# Patient Record
Sex: Male | Born: 1990
Health system: Southern US, Community
[De-identification: ages and names within clinical notes are randomized; demographics above are authoritative.]

## PROBLEM LIST (undated history)

## (undated) DIAGNOSIS — F988 Other specified behavioral and emotional disorders with onset usually occurring in childhood and adolescence: Secondary | ICD-10-CM

## (undated) DIAGNOSIS — M726 Necrotizing fasciitis: Secondary | ICD-10-CM

## (undated) DIAGNOSIS — Z1612 Extended spectrum beta lactamase (ESBL) resistance: Secondary | ICD-10-CM

## (undated) DIAGNOSIS — A419 Sepsis, unspecified organism: Secondary | ICD-10-CM

## (undated) DIAGNOSIS — A499 Bacterial infection, unspecified: Secondary | ICD-10-CM

## (undated) HISTORY — DX: Necrotizing fasciitis: M72.6

## (undated) HISTORY — DX: Gilbert syndrome: E80.4

## (undated) HISTORY — DX: Extended spectrum beta lactamase (ESBL) resistance: A49.9

## (undated) HISTORY — DX: Other specified behavioral and emotional disorders with onset usually occurring in childhood and adolescence: F98.8

## (undated) HISTORY — DX: Extended spectrum beta lactamase (ESBL) resistance: Z16.12

---

## 2009-04-10 ENCOUNTER — Ambulatory Visit: Payer: Self-pay | Admitting: Family Medicine

## 2009-04-10 DIAGNOSIS — F988 Other specified behavioral and emotional disorders with onset usually occurring in childhood and adolescence: Secondary | ICD-10-CM | POA: Insufficient documentation

## 2009-04-30 ENCOUNTER — Encounter: Payer: Self-pay | Admitting: Family Medicine

## 2009-04-30 DIAGNOSIS — J45909 Unspecified asthma, uncomplicated: Secondary | ICD-10-CM | POA: Insufficient documentation

## 2009-05-06 ENCOUNTER — Ambulatory Visit: Payer: Self-pay | Admitting: Family Medicine

## 2009-05-06 DIAGNOSIS — L659 Nonscarring hair loss, unspecified: Secondary | ICD-10-CM | POA: Insufficient documentation

## 2009-05-07 ENCOUNTER — Encounter: Payer: Self-pay | Admitting: Family Medicine

## 2009-05-08 ENCOUNTER — Encounter: Payer: Self-pay | Admitting: Family Medicine

## 2009-06-11 ENCOUNTER — Telehealth: Payer: Self-pay | Admitting: Family Medicine

## 2009-07-23 ENCOUNTER — Telehealth: Payer: Self-pay | Admitting: Family Medicine

## 2009-12-07 ENCOUNTER — Ambulatory Visit: Payer: Self-pay | Admitting: Family Medicine

## 2009-12-08 ENCOUNTER — Encounter: Payer: Self-pay | Admitting: Family Medicine

## 2010-01-08 ENCOUNTER — Ambulatory Visit: Payer: Self-pay | Admitting: Family Medicine

## 2010-01-08 DIAGNOSIS — B9789 Other viral agents as the cause of diseases classified elsewhere: Secondary | ICD-10-CM | POA: Insufficient documentation

## 2010-03-17 ENCOUNTER — Telehealth: Payer: Self-pay | Admitting: Family Medicine

## 2010-08-05 ENCOUNTER — Ambulatory Visit: Payer: Self-pay | Admitting: Family Medicine

## 2010-08-05 DIAGNOSIS — L738 Other specified follicular disorders: Secondary | ICD-10-CM | POA: Insufficient documentation

## 2010-08-05 DIAGNOSIS — L500 Allergic urticaria: Secondary | ICD-10-CM | POA: Insufficient documentation

## 2010-08-05 DIAGNOSIS — B079 Viral wart, unspecified: Secondary | ICD-10-CM | POA: Insufficient documentation

## 2010-08-05 DIAGNOSIS — L219 Seborrheic dermatitis, unspecified: Secondary | ICD-10-CM | POA: Insufficient documentation

## 2010-12-10 ENCOUNTER — Ambulatory Visit
Admission: RE | Admit: 2010-12-10 | Discharge: 2010-12-10 | Payer: Self-pay | Source: Home / Self Care | Attending: Family Medicine | Admitting: Family Medicine

## 2010-12-10 DIAGNOSIS — S6000XA Contusion of unspecified finger without damage to nail, initial encounter: Secondary | ICD-10-CM | POA: Insufficient documentation

## 2010-12-10 DIAGNOSIS — J309 Allergic rhinitis, unspecified: Secondary | ICD-10-CM | POA: Insufficient documentation

## 2010-12-30 ENCOUNTER — Ambulatory Visit
Admission: RE | Admit: 2010-12-30 | Discharge: 2010-12-30 | Payer: Self-pay | Source: Home / Self Care | Attending: Family Medicine | Admitting: Family Medicine

## 2010-12-30 DIAGNOSIS — J029 Acute pharyngitis, unspecified: Secondary | ICD-10-CM | POA: Insufficient documentation

## 2011-01-04 NOTE — Letter (Signed)
Summary: Out of School  [facility]  [address]   [place], [place] [address]   Phone: [phone]  Fax: [phone]          January 08, 2010   Student:  Tyler Brock    To Whom It May Concern:   For Medical reasons, please excuse the above named student from school for the following dates:  Start:   January 08, 2010  End:    January 08, 2010  If you need additional information, please feel free to contact our office.   Sincerely,       Boyd Buffalo, MD    ****This is a legal document and cannot be tampered with.  Schools are authorized to verify all information and to do so accordingly. 

## 2011-01-04 NOTE — Assessment & Plan Note (Signed)
Summary: H/A, COUGH // RS   Vital Signs:  Patient profile:   20 year old male Temp:     99.5 degrees F oral BP sitting:   100 / 60  (left arm) Cuff size:   regular  Vitals Entered By: Sid Falcon LPN (January 08, 2010 4:08 PM) CC: Headache, cough, fever X e days   History of Present Illness: Acute visit. Onset about 2-3 days ago headache, mostly dry cough, mild photophobia, fatigue, body aches, and possible low-grade fever. No known ill exposures.. Denies any nausea, vomiting, or diarrhea. No rashes. Has not taken any medications other than Tylenol  Allergies: 1)  ! Codeine Sulfate (Codeine Sulfate) 2)  ! 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   Ears:  External ear exam shows no significant lesions or deformities.  Otoscopic examination reveals clear canals, tympanic membranes are intact bilaterally without bulging, retraction, inflammation or discharge. Hearing is grossly normal bilaterally. Nose:  External nasal examination shows no deformity or inflammation. Nasal mucosa are pink and moist without lesions or exudates. Mouth:  Oral mucosa and oropharynx without lesions or exudates.  Teeth in good repair. Neck:  No deformities, masses, or tenderness noted. Lungs:  Normal respiratory effort, chest expands symmetrically. Lungs are clear to auscultation, no crackles or wheezes. Heart:  Normal rate and regular rhythm. S1 and S2 normal without gallop, murmur, click, rub or other extra sounds. Skin:  no rash   Impression & Recommendations:  Problem #  1:  VIRAL INFECTION (ICD-079.99) treat symptomatically with Advil, Alleve, or Tylenol and consider OTC Mucinex.  Complete Medication List: 1)  Ritalin 10 Mg Tabs (Methylphenidate hcl) .... As needed 2)  Vyvanse 40 Mg Caps (Lisdexamfetamine dimesylate) .... Once daily 3)  Vyvanse 40 Mg Caps (Lisdexamfetamine dimesylate) .... Once daily may fill in one month 4)  Vyvanse 40 Mg Caps (Lisdexamfetamine dimesylate) .... Once daily may fill on two months 5)  Fluocinolone Acetonide 0.025 % Crea (Fluocinolone acetonide) .... Apply to affected rash two times a day prn  Patient Instructions: 1)  Try Mucinex or Mucinex DM for cough 2)  Get plenty of rest, drink lots of clear liquids, and use Tylenol or Ibuprofen for fever and comfort. Return in 7-10 days if you're not better: sooner if you'er feeling worse.

## 2011-01-04 NOTE — Assessment & Plan Note (Signed)
Summary: rash that comes and goes//ccm pt rsc appt time/njr   Vital Signs:  Patient profile:   20 year old male Temp:     98.0 degrees F oral BP sitting:   120 / 80  (left arm) Cuff size:   regular  Vitals Entered By: Sid Falcon LPN (August 05, 2010 4:18 PM)  History of Present Illness: Patient is here with multiple skin issues as below.  Has noticed some intermittent urticarial type lesions induced by pressure especially on both forearms. Does not use any antihistamines.  Has noticed painful non-pustular papule dorsum left forearm and left eyebrow region. No history of MRSA. No fever or chills.  patient has common wart right middle finger over DIP joint. This present for several weeks. He has not tried any over-the-counter topical treatments.  He has scaly slightly pruritic rash mid anterior chest wall. Also has recurrent scaly similar rash involving the ear canals and posterior to the ears bilaterally. Has used some cortisone cream in the past which has helped. No facial rash.  Allergies: 1)  ! Codeine Sulfate (Codeine Sulfate) 2)  ! 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    Physical Exam  General:  Well-developed,well-nourished,in no acute  distress; alert,appropriate and cooperative throughout examination Mouth:  Oral mucosa and oropharynx without lesions or exudates.  Teeth in good repair. Neck:  No deformities, masses, or tenderness noted. Lungs:  Normal respiratory effort, chest expands symmetrically. Lungs are clear to auscultation, no crackles or wheezes. Heart:  Normal rate and regular rhythm. S1 and S2 normal without gallop, murmur, click, rub or other extra sounds. Extremities:  right middle finger DIP joint common wart about 3 mm diameter Skin:  patient has scaly rash posterior both ears involving both ear canals left greater than right with erythematous base. He has urticaria which are induced by pressure on both forearms. He has nonspecific erythematous papule dorsum left forearm and left eyebrow region without pustular center.   Impression & Recommendations:  Problem # 1:  FOLLICULITIS (ICD-704.8) Assessment New reassurance given. Observation  Problem # 2:  ALLERGIC URTICARIA (ICD-708.0) Assessment: New try over-the-counter antihistamine such as Zyrtec or Allegra  Problem # 3:  DERMATITIS, SEBORRHEIC (ICD-690.10) Assessment: Deteriorated steroid cream prescribed. Cautioned about not using more than 2 weeks continuously  Problem # 4:  WART, VIRAL (ICD-078.10) Assessment: New  recommended treatment with liquid nitrogen patient consented after review of risks and benefits. This was treated several times without difficulty patient tolerated well he is aware these are resistant to treatment  Orders: Wart Destruct <14 ([address])  Complete Medication List: 1)  Ritalin 10 Mg Tabs (Methylphenidate hcl) .... As needed 2)  Vyvanse 40 Mg Caps (Lisdexamfetamine dimesylate) .... Once daily 3)  Vyvanse 40 Mg Caps (Lisdexamfetamine dimesylate) .... Once daily may fill in one month 4)  Vyvanse 40 Mg Caps (Lisdexamfetamine dimesylate) .... Once daily may fill on two months 5)  Fluocinolone Acetonide 0.025 % Crea  (Fluocinolone acetonide) .Marland KitchenMarland KitchenMarland Kitchen  Apply to affected rash two times a day prn  Patient Instructions: 1)  Consider antihistamine such as Allegra or Zyrtec for hives. 2)  Limit use of steroid cream to face and chest wall to no longer than 2 weeks continuously. Prescriptions: FLUOCINOLONE ACETONIDE 0.025 % CREA (FLUOCINOLONE ACETONIDE) apply to affected rash two times a day prn  #30 gm x 2   Entered and Authorized by:   Evelena Peat MD   Signed by:   Evelena Peat MD on 08/05/2010   Method used:   [facility] to        [facility]  [place] [address] [facility]retail[facility]       [address]       [place], [place]  [address]       Ph: [phone] or [phone]       Fax: [phone]   RxID:   [id number]

## 2011-01-04 NOTE — Assessment & Plan Note (Signed)
Summary: med check/cjr   Vital Signs:  Patient profile:   20 year old male Weight:      144 pounds Temp:     97.8 degrees F oral BP sitting:   100 / 60  (left arm) Cuff size:   regular  Vitals Entered By: Sid Falcon LPN (December 07, 2009 10:07 AM) CC: med check   History of Present Illness: Patient seen to discuss ADD issues.  He attends college locally and takes Vyvanse. This generally works well for him. No side effects from medication. Has had great difficulty gaining weight. Plans to start regimented exercise program soon. He has several questions regarding dietary factors and weight gain. He does not skip meals.  Patient also relates that he missed several classes last September which eventually required him to drop a class. Because of dropping that class he is in jeopardy of losing some financial aid. He had flulike illness with multiple symptoms including cough, body aches, nasal congestion and fever and missed classes from around the 10 th of September to 20 th of September. He is requesting an excuse because of illness requiring him to miss those classes.  Allergies: 1)  ! Codeine Sulfate (Codeine Sulfate) 2)  ! * Dexadrene  Past History:  Past Medical History: Last updated: 04/30/2009 ADD/Gilberts Disease Hepatitis/Jaundice Asthma--exercise induced  Review of Systems  The patient denies anorexia, fever, weight loss, chest pain, and headaches.    Physical Exam  General:  Well-developed,well-nourished,in no acute distress; alert,appropriate and cooperative throughout examination Mouth:  Oral mucosa and oropharynx without lesions or exudates.  Teeth in good repair. Neck:  No deformities, masses, or tenderness noted. Lungs:  Normal respiratory effort, chest expands symmetrically. Lungs are clear to auscultation, no crackles or wheezes. Heart:  Normal rate and regular rhythm. S1 and S2 normal without gallop, murmur, click, rub or other extra sounds.   Impression  & Recommendations:  Problem # 1:  ADD (ICD-314.00) stable current medication. Long discussion with patient regarding weight gain issues and how to safely gain lean body mass. We gave him some suggestions regarding recommended protein intake and overall calorie intake.  Complete Medication List: 1)  Ritalin 10 Mg Tabs (Methylphenidate hcl) .... As needed 2)  Vyvanse 40 Mg Caps (Lisdexamfetamine dimesylate) .... Once daily 3)  Vyvanse 40 Mg Caps (Lisdexamfetamine dimesylate) .... Once daily may fill in one month 4)  Vyvanse 40 Mg Caps (Lisdexamfetamine dimesylate) .... Once daily may fill on two months 5)  Fluocinolone Acetonide 0.025 % Crea (Fluocinolone acetonide) .... Apply to affected rash two times a day prn

## 2011-01-04 NOTE — Progress Notes (Signed)
Summary: Vyvanse refill X 3 months  Phone Note Call from Patient   Summary of Call: needs refill Vyvanse Initial call taken by: Evelena Peat MD,  March 17, 2010 2:14 PM  Follow-up for Phone Call        Rx given to father who was here for OV. Follow-up by: Sid Falcon LPN,  March 17, 2010 5:28 PM    Prescriptions: VYVANSE 40 MG CAPS (LISDEXAMFETAMINE DIMESYLATE) once daily May fill on two months  #30 x 0   Entered and Authorized by:   Evelena Peat MD   Signed by:   Evelena Peat MD on 03/17/2010   Method used:   Print then Give to Patient   RxID:   [id number] VYVANSE 40 MG CAPS (LISDEXAMFETAMINE DIMESYLATE) once daily May fill in one month  #30 x 0   Entered and Authorized by:   Evelena Peat MD   Signed by:   Evelena Peat MD on 03/17/2010   Method used:   Print then Give to Patient   RxID:   [id number] VYVANSE 40 MG CAPS (LISDEXAMFETAMINE DIMESYLATE) once daily  #30 x 0   Entered and Authorized by:   Evelena Peat MD   Signed by:   Evelena Peat MD on 03/17/2010   Method used:   Print then Give to Patient   RxID:   [id number]

## 2011-01-04 NOTE — Letter (Signed)
Summary: Out of School  [facility]  [address] [place], [place] [address]   Phone: [phone]  Fax: [phone]          January 08, 2010   Student:  Bethann Berkshire    To Whom It May Concern:   For Medical reasons, please excuse the above named student from school for the following dates:  Start:   January 08, 2010  End:    January 08, 2010  If you need additional information, please feel free to contact our office.   Sincerely,       Evelena Peat, MD    ****This is a legal document and cannot be tampered with.  Schools are authorized to verify all information and to do so accordingly.

## 2011-01-04 NOTE — Letter (Signed)
Summary: Unable to attend school due to illness  Unable to attend school due to illness   Imported By: Maryln Gottron 12/14/2009 10:24:09  _____________________________________________________________________  External Attachment:    Type:   Image     Comment:   External Document

## 2011-01-06 NOTE — Assessment & Plan Note (Signed)
Summary: ? strep//ccm   Vital Signs:  Patient profile:   20 year old male Temp:     98.6 degrees F BP sitting:   120 / 80  History of Present Illness: Patient seen with three-day history of sore throat. Some nonproductive cough. Minimal nasal congestion symptoms. Girlfriend recently with similar symptoms. No fever. Diffuse body aches. Some minimal relief with ibuprofen. Mild headaches. No rash.  Sore throat exacerbated by acidic foods.  Symptoms of moderate severity.  Allergies: 1)  ! Codeine Sulfate (Codeine Sulfate) 2)  ! 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  Otoscopic examination reveals clear canals, tympanic membranes are intact bilaterally without bulging, retraction, inflammation or discharge. Hearing is grossly normal bilaterally. Mouth:  mild erythema without exudate Neck:  No deformities, masses, or tenderness noted. Lungs:  Normal respiratory effort, chest expands symmetrically. Lungs are clear to auscultation, no crackles or wheezes. Heart:  Normal rate and regular rhythm. S1 and S2  normal without gallop, murmur, click, rub or other extra sounds. Skin:  no rashes.   Cervical Nodes:  No lymphadenopathy noted   Impression & Recommendations:  Problem # 1:  SORE THROAT (ICD-462) rapid strep negative. Suspect viral. Symptomatic treatment. Work note written. Orders: Rapid Strep (48546)  Complete Medication List: 1)  Ritalin 10 Mg Tabs (Methylphenidate hcl) .... As needed 2)  Vyvanse 40 Mg Caps (Lisdexamfetamine dimesylate) .... Once daily 3)  Vyvanse 40 Mg Caps (Lisdexamfetamine dimesylate) .... Once daily may fill in one month 4)  Vyvanse 40 Mg Caps (Lisdexamfetamine dimesylate) .... Once daily may fill on two months 5)  Fluocinolone Acetonide 0.025 % Crea (Fluocinolone acetonide) .... Apply to affected rash two times a day prn 6)  Singulair 10 Mg Tabs (Montelukast sodium) .... One by mouth once daily  Patient Instructions: 1)  Continued Advil, Tylenol, or Aleve for symptomatic relief 2)  Please schedule a follow-up appointment as needed .    Orders Added: 1)  Rapid Strep [87880] 2)  Est. Patient Level III [27035]

## 2011-01-06 NOTE — Assessment & Plan Note (Signed)
Summary: allergies/dm   Vital Signs:  Patient profile:   20 year old male Weight:      157 pounds Temp:     98.2 degrees F oral BP sitting:   120 / 80  (left arm) Cuff size:   regular  Vitals Entered By: Sid Falcon LPN (December 10, 2010 9:58 AM)  History of Present Illness: R index finger injury 3 days ago.  Small cut under nail Bled minimally. No signs of infection. Tetanus up to date.  Freq sneezing and tried brother's Singulair and worked well. Worked better than benadryl.  Would like rx for singulair. Symptoms worse at home.  Has other allergy symptoms including nasal congestion and itchy watery eyes.  Needs refill Vyvanse and working well.  Attending [employer]. No side effects from medication.  Allergies: 1)  ! Codeine Sulfate (Codeine Sulfate) 2)  ! 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    Physical Exam  General:  Well-developed,well-nourished,in no acute distress; alert,appropriate and cooperative throughout examination Head:  normocephalic and atraumatic.   Eyes:  No corneal or conjunctival inflammation noted. EOMI.  Perrla. Funduscopic exam benign, without hemorrhages, exudates or papilledema. Vision grossly normal. Ears:  External ear exam shows no significant lesions or deformities.  Otoscopic examination reveals clear canals, tympanic membranes are intact bilaterally without bulging, retraction, inflammation or discharge. Hearing is grossly normal bilaterally. Nose:  External nasal examination shows no deformity or inflammation. Nasal mucosa are pink and moist without lesions or exudates. Mouth:  Oral mucosa and oropharynx without lesions or exudates.  Teeth in good repair. Neck:  No deformities, masses, or tenderness noted. Lungs:  Normal respiratory effort, chest expands symmetrically. Lungs are clear to auscultation, no crackles or wheezes. Heart:  Normal rate and regular rhythm. S1 and S2 normal without gallop, murmur, click, rub or other extra sounds. Extremities:  R index finger small subungual bleed but no signif sized hematoma.  Nontender.  No signs of infection. Cervical Nodes:  No lymphadenopathy noted Psych:  normally interactive, good eye contact, not anxious appearing, and not depressed appearing.     Impression & Recommendations:  Problem # 1:  ALLERGIC RHINITIS (ICD-477.9) singulair 10 mg daily.  Problem # 2:  SUBUNGUAL HEMATOMA (ICD-923.3) reassurance.  Does not warrant drainage.  Problem # 3:  ADD (ICD-314.00) meds refilled.  Complete Medication List: 1)  Ritalin 10 Mg Tabs (Methylphenidate hcl) .... As needed 2)  Vyvanse 40 Mg Caps (Lisdexamfetamine dimesylate) .... Once daily 3)  Vyvanse 40 Mg Caps (Lisdexamfetamine dimesylate) .... Once daily may fill in one month 4)  Vyvanse 40 Mg Caps (Lisdexamfetamine dimesylate) .... Once daily may fill on two months 5)  Fluocinolone Acetonide 0.025 % Crea (Fluocinolone acetonide) .... Apply to affected rash two times a day prn 6)  Singulair 10 Mg Tabs (Montelukast sodium) .... One by mouth once daily Prescriptions: VYVANSE 40 MG CAPS  (  LISDEXAMFETAMINE DIMESYLATE) once daily May fill on two months  #30 x 0   Entered and Authorized by:   Evelena Peat MD   Signed by:   Evelena Peat MD on 12/10/2010   Method used:   Print then Give to Patient   RxID:   [id number] VYVANSE 40 MG CAPS (LISDEXAMFETAMINE DIMESYLATE) once daily May fill in one month  #30 x 0   Entered and Authorized by:   Evelena Peat MD   Signed by:   Evelena Peat MD on 12/10/2010   Method used:   Print then Give to Patient   RxID:   [id number] VYVANSE 40 MG CAPS (LISDEXAMFETAMINE DIMESYLATE) once daily  #30 x 0   Entered and Authorized by:   Evelena Peat MD   Signed by:   Evelena Peat MD on 12/10/2010   Method used:   Print then Give to Patient   RxID:   [id number] SINGULAIR 10 MG TABS (MONTELUKAST SODIUM) one by mouth once daily  #30 x 11   Entered and Authorized by:   Evelena Peat MD   Signed by:   Evelena Peat MD on 12/10/2010   Method used:   [facility]        [facility]  [place] [address] [facility]       [address]       [place], [place]  [address]       Ph: [phone] or [phone]       Fax: [phone]   RxID:   [id number]    Orders Added: 1)  Est. Patient Level IV [35361]

## 2011-04-01 ENCOUNTER — Other Ambulatory Visit: Payer: Self-pay | Admitting: Family Medicine

## 2011-04-01 NOTE — Telephone Encounter (Signed)
Please advise 

## 2011-04-01 NOTE — Telephone Encounter (Signed)
Pt called and is req to get new script for Vyvanse 40 mg 1 qd. Pt says he lost script. Lv detailed vm if pt not avail. Pts last med check ov was 12/07/2009

## 2011-04-03 NOTE — Telephone Encounter (Signed)
OK to refill

## 2011-04-04 MED ORDER — LISDEXAMFETAMINE DIMESYLATE 40 MG PO CAPS: 40.0000 mg | ORAL_CAPSULE | ORAL | Status: DC

## 2011-04-04 NOTE — Telephone Encounter (Signed)
One Vyvanse Rx ready for pick-up, pt informed on personally identified VM

## 2011-05-10 ENCOUNTER — Ambulatory Visit (INDEPENDENT_AMBULATORY_CARE_PROVIDER_SITE_OTHER): Payer: Managed Care, Other (non HMO) | Admitting: Internal Medicine

## 2011-05-10 ENCOUNTER — Encounter: Payer: Self-pay | Admitting: Internal Medicine

## 2011-05-10 DIAGNOSIS — L0291 Cutaneous abscess, unspecified: Secondary | ICD-10-CM

## 2011-05-10 DIAGNOSIS — L0390 Cellulitis, unspecified: Secondary | ICD-10-CM

## 2011-05-10 MED ORDER — CEPHALEXIN 500 MG PO CAPS: 500.0000 mg | ORAL_CAPSULE | Freq: Three times a day (TID) | ORAL | Status: AC

## 2011-05-10 NOTE — Assessment & Plan Note (Signed)
Border of erythema marked with pen. Begin keflex to completion. Followup if no improvement or worsening.

## 2011-05-10 NOTE — Progress Notes (Signed)
  Subjective:    Patient ID: Tyler Brock, male    DOB: November 26, 1991, 20 y.o.   MRN: [id number]  HPI Pt presents to clinic for evaluation of insect bite. Several days ago suffered visualized wasp sting to right posterior calf with immediate associated pain.  Area has developed ST swelling/edema and surrounding redness but no associated f/c, dyspnea, tongue swelling or drainage. Attempting otc hydrocortisone prn without significant improvement. No spreading of redness and no other locations of bites. No other alleviating or exacerbating factors. No other complaints.  Reviewed pmh, medications and allergies.  Review of Systems see hpi     Objective:   Physical Exam  Nursing note and vitals reviewed. Constitutional: He appears well-developed and well-nourished. No distress.  HENT:  Head: Normocephalic and atraumatic.  Nose: Nose normal.  Eyes: Conjunctivae are normal. No scleral icterus.  Neurological: He is alert.  Skin: Skin is warm and dry. No rash noted. He is not diaphoretic. There is erythema. No pallor.       Right posterior calf: ~6cm circular area of erythema, warmth and mild st swelling. No discharge or tenderness.  Psychiatric: He has a normal mood and affect.          Assessment & Plan:

## 2011-05-13 ENCOUNTER — Other Ambulatory Visit (INDEPENDENT_AMBULATORY_CARE_PROVIDER_SITE_OTHER): Payer: Managed Care, Other (non HMO)

## 2011-05-13 DIAGNOSIS — Z0000 Encounter for general adult medical examination without abnormal findings: Secondary | ICD-10-CM

## 2011-05-13 LAB — BASIC METABOLIC PANEL
BUN: 17 mg/dL (ref 6–23)
CO2: 30 mEq/L (ref 19–32)
Calcium: 9.2 mg/dL (ref 8.4–10.5)
Creatinine, Ser: 0.9 mg/dL (ref 0.4–1.5)
Glucose, Bld: 81 mg/dL (ref 70–99)

## 2011-05-13 LAB — CBC WITH DIFFERENTIAL/PLATELET
Basophils Absolute: 0 10*3/uL (ref 0.0–0.1)
Eosinophils Absolute: 0.3 10*3/uL (ref 0.0–0.7)
Lymphocytes Relative: 40.5 % (ref 12.0–46.0)
MCHC: 35.1 g/dL (ref 30.0–36.0)
Neutro Abs: 2.2 10*3/uL (ref 1.4–7.7)
Neutrophils Relative %: 47.1 % (ref 43.0–77.0)
Platelets: 181 10*3/uL (ref 150.0–400.0)
RDW: 12.4 % (ref 11.5–14.6)

## 2011-05-13 LAB — HEPATIC FUNCTION PANEL
Albumin: 4.8 g/dL (ref 3.5–5.2)
Alkaline Phosphatase: 87 U/L (ref 39–117)
Bilirubin, Direct: 0.3 mg/dL (ref 0.0–0.3)

## 2011-05-13 LAB — LIPID PANEL
HDL: 36.4 mg/dL — ABNORMAL LOW (ref >39.00)
LDL Cholesterol: 85 mg/dL (ref 0–99)
Total CHOL/HDL Ratio: 4

## 2011-05-13 LAB — POCT URINALYSIS DIPSTICK
Bilirubin, UA: NEGATIVE
Blood, UA: NEGATIVE
Glucose, UA: NEGATIVE
Nitrite, UA: NEGATIVE
Urobilinogen, UA: 0.2

## 2011-05-13 LAB — TSH: TSH: 1.04 u[IU]/mL (ref 0.35–5.50)

## 2011-05-20 ENCOUNTER — Encounter: Payer: Self-pay | Admitting: Family Medicine

## 2011-05-20 ENCOUNTER — Ambulatory Visit (INDEPENDENT_AMBULATORY_CARE_PROVIDER_SITE_OTHER): Payer: Managed Care, Other (non HMO) | Admitting: Family Medicine

## 2011-05-20 VITALS — BP 110/70 | HR 72 | Temp 98.4°F | Resp 12 | Ht 70.5 in | Wt 166.0 lb

## 2011-05-20 DIAGNOSIS — Z0000 Encounter for general adult medical examination without abnormal findings: Secondary | ICD-10-CM

## 2011-05-20 NOTE — Progress Notes (Signed)
  Subjective:    Patient ID: Tyler Brock, male    DOB: 03-28-1991, 20 y.o.   MRN: [id number]  HPI Patient seen for complete physical. He has history of ADD. Otherwise healthy. Concern for appetite suppression with stimulant medication. Since stopping stimulant medication has gained about 20 pounds. No recent exercise. Does have some increased fatigue. Generally sleeps well. No changes in diet.  Has some mild mood swings. No agitation or periods of insomnia. No prolonged periods of depression. Nonsmoker. No illicit drug use. Occasional alcohol use.  No past medical history on file. No past surgical history on file.  reports that he has never smoked. He does not have any smokeless tobacco history on file. He reports that he drinks alcohol. His drug history not on file. family history is not on file. Allergies  Allergen Reactions  . Codeine Sulfate      Review of Systems  Constitutional: Negative for fever, activity change, appetite change and fatigue.  HENT: Negative for ear pain, congestion and trouble swallowing.   Eyes: Negative for pain and visual disturbance.  Respiratory: Negative for cough, shortness of breath and wheezing.   Cardiovascular: Negative for chest pain and palpitations.  Gastrointestinal: Negative for nausea, vomiting, abdominal pain, diarrhea, constipation, blood in stool, abdominal distention and rectal pain.  Genitourinary: Negative for dysuria, hematuria and testicular pain.  Musculoskeletal: Negative for joint swelling and arthralgias.  Skin: Negative for rash.  Neurological: Negative for dizziness, syncope and headaches.  Hematological: Negative for adenopathy.  Psychiatric/Behavioral: Negative for confusion and dysphoric mood.       Objective:   Physical Exam  Constitutional: He is oriented to person, place, and time. He appears well-developed and well-nourished. No distress.  HENT:  Head: Normocephalic and atraumatic.  Right Ear: External ear normal.   Left Ear: External ear normal.  Mouth/Throat: Oropharynx is clear and moist.  Eyes: Conjunctivae and EOM are normal. Pupils are equal, round, and reactive to light.  Neck: Normal range of motion. Neck supple. No thyromegaly present.  Cardiovascular: Normal rate, regular rhythm and normal heart sounds.   No murmur heard. Pulmonary/Chest: No respiratory distress. He has no wheezes. He has no rales.  Abdominal: Soft. Bowel sounds are normal. He exhibits no distension and no mass. There is no tenderness. There is no rebound and no guarding.  Genitourinary:       Testes without mass  Musculoskeletal: He exhibits no edema.  Lymphadenopathy:    He has no cervical adenopathy.  Neurological: He is alert and oriented to person, place, and time. He displays normal reflexes. No cranial nerve deficit.  Skin: No rash noted.  Psychiatric: He has a normal mood and affect.          Assessment & Plan:  Complete physical. Labs reviewed with patient and all favorable. Establish more regular exercise. Discussed stimulant use. Discussed that all stimulants are associated with potential appetite suppression

## 2011-05-20 NOTE — Patient Instructions (Signed)
Establish more consistent exercise 

## 2011-08-12 ENCOUNTER — Encounter: Payer: Self-pay | Admitting: Family Medicine

## 2011-08-15 ENCOUNTER — Ambulatory Visit (INDEPENDENT_AMBULATORY_CARE_PROVIDER_SITE_OTHER): Payer: Managed Care, Other (non HMO) | Admitting: Family Medicine

## 2011-08-15 ENCOUNTER — Encounter: Payer: Self-pay | Admitting: Family Medicine

## 2011-08-15 VITALS — BP 120/70 | Temp 98.0°F | Wt 162.0 lb

## 2011-08-15 DIAGNOSIS — F988 Other specified behavioral and emotional disorders with onset usually occurring in childhood and adolescence: Secondary | ICD-10-CM

## 2011-08-15 DIAGNOSIS — Z23 Encounter for immunization: Secondary | ICD-10-CM

## 2011-08-15 MED ORDER — ATOVAQUONE-PROGUANIL HCL 250-100 MG PO TABS: ORAL_TABLET | ORAL | Status: DC

## 2011-08-15 MED ORDER — CIPROFLOXACIN HCL 500 MG PO TABS: ORAL_TABLET | ORAL | Status: DC

## 2011-08-15 MED ORDER — TYPHOID VACCINE PO CPDR: 1.0000 | DELAYED_RELEASE_CAPSULE | ORAL | Status: AC

## 2011-08-15 MED ORDER — TETANUS-DIPHTH-ACELL PERTUSSIS 5-2.5-18.5 LF-MCG/0.5 IM SUSP: 0.5000 mL | Freq: Once | INTRAMUSCULAR | Status: DC

## 2011-08-15 NOTE — Progress Notes (Signed)
  Subjective:    Patient ID: Tyler Brock, male    DOB: [birth year], 20 y.o.   MRN: [id number]  HPI Here for recommendations regarding travel medicine. Planned trip in October to [place]. No history of confirmed Tdap.  Needs flu vaccine. No history of hepatitis A. Also will need typhoid vaccine and malaria prophylaxis.  Requesting antibiotic for possible traveler's diarrhea. Only drug allergy is codeine.  Patient currently seeing psychiatrist for ADD.  Vyvanse changed to Focalin.   Review of Systems  Constitutional: Negative for fever, chills and appetite change.  Respiratory: Negative for shortness of breath.   Cardiovascular: Negative for chest pain.  Skin: Negative for rash.       Objective:   Physical Exam  Constitutional: He appears well-developed and well-nourished.  Cardiovascular: Normal rate, regular rhythm and normal heart sounds.   No murmur heard. Pulmonary/Chest: Effort normal and breath sounds normal. No respiratory distress. He has no wheezes. He has no rales.  Musculoskeletal: He exhibits no edema.          Assessment & Plan:  Travel medicine consultation. Patient needs tetanus booster along with flu vaccine and hepatitis A. Return in 6 months for hepatitis A booster. Discussed typhoid vaccination with option for oral versus shot and patient prefers the former. Prescribed Vivotif 1 every other day for 4 doses.  Malarone starting 1-2 days prior travel, daily during travel, and for one week after travel.

## 2011-08-15 NOTE — Patient Instructions (Signed)
Check on status of Hepatitis B vaccine.

## 2012-01-03 ENCOUNTER — Ambulatory Visit (INDEPENDENT_AMBULATORY_CARE_PROVIDER_SITE_OTHER): Payer: Managed Care, Other (non HMO) | Admitting: Family

## 2012-01-03 ENCOUNTER — Encounter: Payer: Self-pay | Admitting: Family

## 2012-01-03 DIAGNOSIS — J069 Acute upper respiratory infection, unspecified: Secondary | ICD-10-CM

## 2012-01-03 DIAGNOSIS — J309 Allergic rhinitis, unspecified: Secondary | ICD-10-CM

## 2012-01-03 MED ORDER — METHYLPREDNISOLONE ACETATE 40 MG/ML IJ SUSP
80.0000 mg | Freq: Once | INTRAMUSCULAR | Status: AC
  Administered 2012-01-03: 80 mg via INTRAMUSCULAR

## 2012-01-03 MED ORDER — FEXOFENADINE-PSEUDOEPHED ER 180-240 MG PO TB24: 1.0000 | ORAL_TABLET | Freq: Every day | ORAL | Status: AC

## 2012-01-03 NOTE — Patient Instructions (Signed)

## 2012-01-03 NOTE — Progress Notes (Signed)
Subjective:    Patient ID: Tyler Brock, male    DOB: December 21, 1990, 21 y.o.   MRN: [id number]  HPI 21 year old white male, nonsmoker, patient of Dr. Caryl Never is in today with complaints of cough, congestion, fatigue, sore throat and sneezing that is going on for one day. He has taken DayQuil with no relief. Denies any sick contacts. He denies any fever or chills.  Patient also has a history of ADD, and currently takes Vyvanse for tolerating it well.   Review of Systems  Constitutional: Positive for fatigue.  HENT: Positive for congestion, rhinorrhea and sneezing.   Eyes: Negative.   Respiratory: Positive for cough.   Cardiovascular: Negative.   Genitourinary: Negative.   Skin: Negative.   Neurological: Negative.   Hematological: Negative.   Psychiatric/Behavioral: Negative.        Past Medical History  Diagnosis Date  . ADD (attention deficit disorder)   . Gilbert's disease   . Hepatitis     jaundice  . Asthma     exercise induced     History   Social History  . Marital Status: Single    Spouse Name: N/A    Number of Children: N/A  . Years of Education: N/A   Occupational History  . Not on file.   Social History Main Topics  . Smoking status: Never Smoker   . Smokeless tobacco: Not on file  . Alcohol Use: Yes     socially  . Drug Use: Not on file  . Sexually Active: Not on file   Other Topics Concern  . 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  Family History  Problem Relation Age of Onset  . Alcohol abuse Father   . Cancer Father     lung  . Arthritis Other   . Cancer Other     breast, prostate  . Hyperlipidemia Other   . Hypertension Other     Allergies  Allergen Reactions  . Codeine Sulfate     Current Outpatient Prescriptions on File Prior to Visit  Medication Sig Dispense Refill  . dexmethylphenidate (FOCALIN) 5 MG  tablet Take by mouth daily.       . montelukast (SINGULAIR) 10 MG tablet Take 10 mg by mouth as needed.        Marland Kitchen atovaquone-proguanil (MALARONE) 250-100 MG TABS One daily.  Start 1-2 days prior to travel, daily during travel, and for one week after return.  16 tablet  0  . ciprofloxacin (CIPRO) 500 MG tablet One twice daily for 3 days as needed for traveler's diarrhea.  12 tablet  0  . fluocinolone (SYNALAR) 0.025 % cream Apply 1 application topically 2 (two) times daily as needed.       Marland Kitchen lisdexamfetamine (VYVANSE) 40 MG capsule Take 40 mg by mouth every morning. May fill in one month       . lisdexamfetamine (VYVANSE) 40 MG capsule Take 40 mg by mouth every morning. May fill in two months       . methylphenidate (RITALIN) 10 MG tablet Take 10 mg by mouth as needed.         Current Facility-Administered Medications on File Prior to Visit  Medication Dose Route Frequency Provider Last Rate Last Dose  . TDaP (BOOSTRIX) injection 0.5 mL  0.5 mL Intramuscular Once Kristian Covey, MD        BP  120/80  Temp(Src) 98.6 F (37 C) (Oral)  Wt 161 lb (73.029 kg)chart Objective:   Physical Exam  Constitutional: He is oriented to person, place, and time. He appears well-developed and well-nourished.  HENT:  Right Ear: External ear normal.  Left Ear: External ear normal.  Nose: Nose normal.  Mouth/Throat: Oropharynx is clear and moist.  Neck: Normal range of motion. Neck supple.  Cardiovascular: Normal rate, regular rhythm and normal heart sounds.   Pulmonary/Chest: Effort normal and breath sounds normal.  Musculoskeletal: Normal range of motion.  Neurological: He is alert and oriented to person, place, and time.  Skin: Skin is warm and dry.  Psychiatric: He has a normal mood and affect.          Assessment & Plan:  Assessment: Allergic rhinitis, upper respiratory infection, ADD  Plan: Allegra 24 hours once daily. Depo-Medrol 80 mg IM x1. Note was given for work and school for the next  2 days. Patient to call the office if symptoms worsen or persist, recheck a schedule, and when necessary.

## 2012-03-16 ENCOUNTER — Encounter: Payer: Self-pay | Admitting: Family Medicine

## 2012-03-16 ENCOUNTER — Ambulatory Visit (INDEPENDENT_AMBULATORY_CARE_PROVIDER_SITE_OTHER): Payer: Managed Care, Other (non HMO) | Admitting: Family Medicine

## 2012-03-16 VITALS — BP 122/72 | Temp 98.1°F | Wt 166.0 lb

## 2012-03-16 DIAGNOSIS — J069 Acute upper respiratory infection, unspecified: Secondary | ICD-10-CM

## 2012-03-16 DIAGNOSIS — H6120 Impacted cerumen, unspecified ear: Secondary | ICD-10-CM

## 2012-03-16 DIAGNOSIS — H6691 Otitis media, unspecified, right ear: Secondary | ICD-10-CM

## 2012-03-16 DIAGNOSIS — H6690 Otitis media, unspecified, unspecified ear: Secondary | ICD-10-CM

## 2012-03-16 MED ORDER — AMOXICILLIN 875 MG PO TABS: 875.0000 mg | ORAL_TABLET | Freq: Two times a day (BID) | ORAL | Status: DC

## 2012-03-16 NOTE — Progress Notes (Signed)
  Subjective:    Patient ID: Tyler Brock, male    DOB: 1991/11/26, 21 y.o.   MRN: [id number]  HPI  Patient seen with right ear pain for 2 days. Cold-like symptoms 2 weeks ago. Had residual cough productive of green mucus. Possible low-grade fever yesterday but temperature not taken. Denies any vertigo. No ear drainage. No wheezing.   Review of Systems  Constitutional: Negative for fever and chills.  HENT: Positive for ear pain. Negative for congestion and ear discharge.   Respiratory: Negative for wheezing.        Objective:   Physical Exam  Constitutional: He appears well-developed and well-nourished.  HENT:  Mouth/Throat: Oropharynx is clear and moist.       Cerumen impaction right canal. Left eardrum is clear and normal After cerumen removed right canal, TM is erythematous and dull.  Neck: Neck supple.  Cardiovascular: Normal rate and regular rhythm.   Pulmonary/Chest: Effort normal and breath sounds normal. No respiratory distress. He has no wheezes. He has no rales.  Lymphadenopathy:    He has no cervical adenopathy.          Assessment & Plan:  #1 cerumen impaction right canal #2 recent upper respiratory infection with persistent productive cough.  After cerumen removal has erythema right TM suggesting early otitis media.  Start Amoxicillin.

## 2012-03-16 NOTE — Patient Instructions (Signed)
Consider Debrox or Cerumenex wax softener for right ear

## 2012-03-19 ENCOUNTER — Telehealth: Payer: Self-pay | Admitting: Family Medicine

## 2012-03-19 NOTE — Telephone Encounter (Signed)
Pt been using medicine for ear for 3 days with no relief and would like to know what he needs to do please contact pt

## 2012-03-19 NOTE — Telephone Encounter (Signed)
Pt informed, he denies outter ear discomfort, still a lot of pressure complaints

## 2012-03-19 NOTE — Telephone Encounter (Signed)
Give antibiotics at least one week.  Is he having any pain with moving auricle (outer ear)?  If so, may need topical antibiotic.

## 2012-04-20 ENCOUNTER — Ambulatory Visit (INDEPENDENT_AMBULATORY_CARE_PROVIDER_SITE_OTHER): Payer: Managed Care, Other (non HMO) | Admitting: Family Medicine

## 2012-04-20 VITALS — BP 120/70 | Temp 98.2°F | Wt 163.0 lb

## 2012-04-20 DIAGNOSIS — H66009 Acute suppurative otitis media without spontaneous rupture of ear drum, unspecified ear: Secondary | ICD-10-CM

## 2012-04-20 DIAGNOSIS — H66001 Acute suppurative otitis media without spontaneous rupture of ear drum, right ear: Secondary | ICD-10-CM

## 2012-04-20 DIAGNOSIS — H60399 Other infective otitis externa, unspecified ear: Secondary | ICD-10-CM

## 2012-04-20 DIAGNOSIS — H6091 Unspecified otitis externa, right ear: Secondary | ICD-10-CM

## 2012-04-20 MED ORDER — NEOMYCIN-POLYMYXIN-HC 3.5-10000-1 OT SUSP: 3.0000 [drp] | Freq: Four times a day (QID) | OTIC | Status: AC

## 2012-04-20 MED ORDER — AMOXICILLIN-POT CLAVULANATE 875-125 MG PO TABS: 1.0000 | ORAL_TABLET | Freq: Two times a day (BID) | ORAL | Status: DC

## 2012-04-20 NOTE — Patient Instructions (Signed)
Otitis Externa  Otitis externa ("swimmer's ear") is a germ (bacterial) or fungal infection of the outer ear canal (from the eardrum to the outside of the ear). Swimming in dirty water may cause swimmer's ear. It also may be caused by moisture in the ear from water remaining after swimming or bathing. Often the first signs of infection may be itching in the ear canal. This may progress to ear canal swelling, redness, and pus drainage, which may be signs of infection.  HOME CARE INSTRUCTIONS    Apply the antibiotic drops to the ear canal as prescribed by your doctor.   This can be a very painful medical condition. A strong pain reliever may be prescribed.   Only take over-the-counter or prescription medicines for pain, discomfort, or fever as directed by your caregiver.   If your caregiver has given you a follow-up appointment, it is very important to keep that appointment. Not keeping the appointment could result in a chronic or permanent injury, pain, hearing loss and disability. If there is any problem keeping the appointment, you must call back to this facility for assistance.  PREVENTION    It is important to keep your ear dry. Use the corner of a towel to wick water out of the ear canal after swimming or bathing.   Avoid scratching in your ear. This can damage the ear canal or remove the protective wax lining the canal and make it easier for germs (bacteria) or a fungus to grow.   You may use ear drops made of rubbing alcohol and vinegar after swimming to prevent future "swimmer's ear" infections. Make up a small bottle of equal parts white vinegar and alcohol. Put 3 or 4 drops into each ear after swimming.   Avoid swimming in lakes, polluted water, or poorly chlorinated pools.  SEEK MEDICAL CARE IF:    An oral temperature above 102 F (38.9 C) develops.   Your ear is still painful after 3 days and shows signs of getting worse (redness, swelling, pain, or pus).  MAKE SURE YOU:    Understand these  instructions.   Will watch your condition.   Will get help right away if you are not doing well or get worse.  Document Released: 11/21/2005 Document Revised: 11/10/2011 Document Reviewed: 06/27/2008  [employer] Patient Information 2012 [employer].

## 2012-04-20 NOTE — Progress Notes (Signed)
  Subjective:    Patient ID: Tyler Brock, male    DOB: 05-Jul-1991, 21 y.o.   MRN: [id number]  HPI  Acute visit. Right ear pain for the past few days with some drainage in the canal. Was seen recently and had cerumen impaction was noted to have acute suppurative right otitis media. He finished full course of amoxicillin and felt better on medication. Denies any headaches. No fever or chills. No sore throat. No hearing loss.   Review of Systems  Constitutional: Negative for fever and chills.  HENT: Positive for ear pain and ear discharge. Negative for hearing loss, neck pain and neck stiffness.   Respiratory: Negative for cough and shortness of breath.        Objective:   Physical Exam  Constitutional: He appears well-developed and well-nourished.  HENT:  Left Ear: External ear normal.  Mouth/Throat: Oropharynx is clear and moist.       Patient has some mild swelling and erythema right external canal. Some yellow crusted drainage. Eardrum is dull and slightly erythematous. Left ear exam is normal  Neck: Neck supple.  Pulmonary/Chest: Effort normal and breath sounds normal. No respiratory distress. He has no wheezes. He has no rales.          Assessment & Plan:  Right otitis externa. Right suppurative otitis media not fully resolved. Augmentin 875 mg twice daily for 10 days. Cortisporin Otic suspension 3 drops right ear 4 times daily. Followup in 2 weeks if symptoms not resolving.

## 2012-10-02 ENCOUNTER — Ambulatory Visit (INDEPENDENT_AMBULATORY_CARE_PROVIDER_SITE_OTHER): Payer: Managed Care, Other (non HMO) | Admitting: Family Medicine

## 2012-10-02 ENCOUNTER — Encounter: Payer: Self-pay | Admitting: Family Medicine

## 2012-10-02 VITALS — BP 128/80 | HR 65 | Temp 98.3°F | Resp 16 | Wt 164.0 lb

## 2012-10-02 DIAGNOSIS — H6090 Unspecified otitis externa, unspecified ear: Secondary | ICD-10-CM

## 2012-10-02 DIAGNOSIS — H60399 Other infective otitis externa, unspecified ear: Secondary | ICD-10-CM

## 2012-10-02 MED ORDER — CIPROFLOXACIN-DEXAMETHASONE 0.3-0.1 % OT SUSP: 4.0000 [drp] | Freq: Two times a day (BID) | OTIC | Status: DC

## 2012-10-02 NOTE — Progress Notes (Signed)
Chief Complaint  Patient presents with  . ear    pain lt ear    HPI:  L Ear pain -started:2 days ago -symptoms: L ear pain -denies:fever, SOB, NVD, tooth pain, strep or mono exposure, nasal congestion -has tried: nothing -sick contacts: none -Hx of: otitis externa once before that resolved with drops, does swim, but has been 2 months, does use qtip in ears  ROS: See pertinent positives and negatives per HPI.  Past Medical History  Diagnosis Date  . ADD (attention deficit disorder)   . Gilbert's disease   . Hepatitis     jaundice  . Asthma     exercise induced     Family History  Problem Relation Age of Onset  . Alcohol abuse Father   . Cancer Father     lung  . Arthritis Other   . Cancer Other     breast, prostate  . Hyperlipidemia Other   . Hypertension Other     History   Social History  . Marital Status: Single    Spouse Name: N/A    Number of Children: N/A  . Years of Education: N/A   Social History Main Topics  . Smoking status: Never Smoker   . Smokeless tobacco: None  . Alcohol Use: Yes     socially  . Drug Use: None  . Sexually Active: None   Other Topics Concern  . 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 , Disp: , Rfl: ;  fexofenadine-pseudoephedrine (ALLEGRA-D 24) 180-240 MG per 24 hr tablet, Take 1 tablet by mouth daily., Disp: 30 tablet, Rfl: 2;  ciprofloxacin-dexamethasone (CIPRODEX) otic suspension, Place 4 drops into the left ear 2 (two) times daily. For 10 days., Disp: 7.5 mL, Rfl: 0  EXAM:  Filed Vitals:   10/02/12 1427  BP: 128/80  Pulse: 65  Temp: 98.3 F (36.8 C)  Resp: 16    There is no height on file to calculate BMI.  GENERAL: vitals reviewed and listed above, alert, oriented, appears well hydrated and in no acute distress  HEENT: atraumatic,  conjunttiva clear, no obvious abnormalities on inspection of external nose and ears, L ear canal with mild post erythema, swelling and crusting, TM normal  NECK: no obvious masses on inspection  MS: moves all extremities without noticeable abnormality  PSYCH: pleasant and cooperative, no obvious depression or anxiety  ASSESSMENT AND PLAN:  Discussed the following assessment and plan:  1. Otitis externa  ciprofloxacin-dexamethasone (CIPRODEX) otic suspension  -otitis externa mild, tx per above and return precuations -Patient advised to return or notify a doctor immediately if symptoms worsen or persist or new concerns arise.  There are no Patient Instructions on file for this visit.   Kriste Basque R.

## 2012-10-02 NOTE — Patient Instructions (Addendum)
Otitis Externa Otitis externa is a bacterial or fungal infection of the outer ear canal. This is the area from the eardrum to the outside of the ear. Otitis externa is sometimes called "swimmer's ear." CAUSES  Possible causes of infection include:  Swimming in dirty water.  Moisture remaining in the ear after swimming or bathing.  Mild injury (trauma) to the ear.  Objects stuck in the ear (foreign body).  Cuts or scrapes (abrasions) on the outside of the ear. SYMPTOMS  The first symptom of infection is often itching in the ear canal. Later signs and symptoms may include swelling and redness of the ear canal, ear pain, and yellowish-white fluid (pus) coming from the ear. The ear pain may be worse when pulling on the earlobe. DIAGNOSIS  Your caregiver will perform a physical exam. A sample of fluid may be taken from the ear and examined for bacteria or fungi. TREATMENT  Antibiotic ear drops are often given for 10 to 14 days. Treatment may also include pain medicine or corticosteroids to reduce itching and swelling. PREVENTION   Keep your ear dry. Use the corner of a towel to absorb water out of the ear canal after swimming or bathing.  Avoid scratching or putting objects inside your ear. This can damage the ear canal or remove the protective wax that lines the canal. This makes it easier for bacteria and fungi to grow.  Avoid swimming in lakes, polluted water, or poorly chlorinated pools.  You may use ear drops made of rubbing alcohol and vinegar after swimming. Combine equal parts of white vinegar and alcohol in a bottle. Put 3 or 4 drops into each ear after swimming. HOME CARE INSTRUCTIONS   Apply antibiotic ear drops to the ear canal as prescribed by your caregiver.  Only take over-the-counter or prescription medicines for pain, discomfort, or fever as directed by your caregiver.  If you have diabetes, follow any additional treatment instructions from your caregiver.  Keep all  follow-up appointments as directed by your caregiver. SEEK MEDICAL CARE IF:   You have a fever.  Your ear is still red, swollen, painful, or draining pus after 3 days.  Your redness, swelling, or pain gets worse.  You have a severe headache.  You have redness, swelling, pain, or tenderness in the area behind your ear. MAKE SURE YOU:   Understand these instructions.  Will watch your condition.  Will get help right away if you are not doing well or get worse. Document Released: 11/21/2005 Document Revised: 02/13/2012 Document Reviewed: 12/08/2011 [employer] Patient Information 2013 [employer].  

## 2012-11-05 ENCOUNTER — Ambulatory Visit: Payer: Managed Care, Other (non HMO) | Admitting: Family Medicine

## 2013-12-05 DIAGNOSIS — A419 Sepsis, unspecified organism: Secondary | ICD-10-CM

## 2013-12-05 HISTORY — DX: Sepsis, unspecified organism: A41.9

## 2014-02-21 ENCOUNTER — Ambulatory Visit (INDEPENDENT_AMBULATORY_CARE_PROVIDER_SITE_OTHER): Payer: Managed Care, Other (non HMO) | Admitting: Family Medicine

## 2014-02-21 ENCOUNTER — Encounter: Payer: Self-pay | Admitting: Family Medicine

## 2014-02-21 VITALS — BP 128/74 | HR 80 | Temp 98.3°F | Wt 204.0 lb

## 2014-02-21 DIAGNOSIS — J029 Acute pharyngitis, unspecified: Secondary | ICD-10-CM

## 2014-02-21 DIAGNOSIS — F988 Other specified behavioral and emotional disorders with onset usually occurring in childhood and adolescence: Secondary | ICD-10-CM

## 2014-02-21 LAB — POCT RAPID STREP A (OFFICE): RAPID STREP A SCREEN: NEGATIVE

## 2014-02-21 MED ORDER — AZITHROMYCIN 250 MG PO TABS: ORAL_TABLET | ORAL | Status: AC

## 2014-02-21 MED ORDER — LISDEXAMFETAMINE DIMESYLATE 40 MG PO CAPS: 40.0000 mg | ORAL_CAPSULE | ORAL | Status: DC

## 2014-02-21 MED ORDER — AMOXICILLIN 875 MG PO TABS: 875.0000 mg | ORAL_TABLET | Freq: Two times a day (BID) | ORAL | Status: AC

## 2014-02-21 NOTE — Progress Notes (Signed)
Pre visit review using our clinic review tool, if applicable. No additional management support is needed unless otherwise documented below in the visit note. 

## 2014-02-21 NOTE — Progress Notes (Signed)
   Subjective:    Patient ID: Tyler BerkshireDaniel Saravia, male    DOB: February 23, 1991, 23 y.o.   MRN: [id number]  Sore Throat  Associated symptoms include congestion and coughing. Pertinent negatives include no headaches.   3 day history of sore throat. 2 week history of sinus congestion and intermittent facial pain. Occasional discolored nasal mucus. He thinks he had some fever off and on for past week but none today. He has occasional dry cough. No relief with over-the-counter medications.  Patient has history of ADD. 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 This has worked well for him in past.   Past Medical History  Diagnosis Date  . ADD (attention deficit disorder)   . Gilbert's disease   . Hepatitis     jaundice  . Asthma     exercise induced    No past surgical history on file.  reports that he has never smoked. He does not have any smokeless tobacco history on file. He reports that he drinks alcohol. His drug history is not on file. family history includes Alcohol abuse in his father; Arthritis in his other; Cancer in his father and other; Hyperlipidemia in his other; Hypertension in his other. Allergies  Allergen Reactions  . Codeine Sulfate       Review of Systems  Constitutional: Positive for fever. Negative for chills.  HENT: Positive for congestion, sinus pressure and sore throat.   Respiratory: Positive for cough.   Neurological: Negative for headaches.  Hematological: Negative for adenopathy.       Objective:   Physical Exam  Constitutional: He appears well-developed and well-nourished.  HENT:  Right Ear: External ear normal.  Left Ear: External ear normal.  Minimal posterior pharynx erythema without exudate  Neck: Neck supple.  Pulmonary/Chest: Effort normal and breath sounds normal. No respiratory distress. He has no wheezes. He has no rales.  Lymphadenopathy:    He has no cervical adenopathy.            Assessment & Plan:   Pharyngitis. Question sinusitis. If sinus symptoms persist into next week start Zithromax. Rapid strep negative.  Attention deficit disorder. Refill Vyvanse 40 mg one daily with 3 month prescription written

## 2014-06-23 ENCOUNTER — Encounter: Payer: Self-pay | Admitting: Family Medicine

## 2014-06-23 ENCOUNTER — Ambulatory Visit (INDEPENDENT_AMBULATORY_CARE_PROVIDER_SITE_OTHER): Payer: BC Managed Care – PPO | Admitting: Family Medicine

## 2014-06-23 VITALS — BP 124/78 | HR 82 | Temp 98.4°F | Wt 198.0 lb

## 2014-06-23 DIAGNOSIS — H65199 Other acute nonsuppurative otitis media, unspecified ear: Secondary | ICD-10-CM

## 2014-06-23 DIAGNOSIS — H65119 Acute and subacute allergic otitis media (mucoid) (sanguinous) (serous), unspecified ear: Secondary | ICD-10-CM

## 2014-06-23 DIAGNOSIS — H65112 Acute and subacute allergic otitis media (mucoid) (sanguinous) (serous), left ear: Secondary | ICD-10-CM

## 2014-06-23 MED ORDER — LISDEXAMFETAMINE DIMESYLATE 40 MG PO CAPS: 40.0000 mg | ORAL_CAPSULE | ORAL | Status: DC

## 2014-06-23 MED ORDER — TRIAMCINOLONE ACETONIDE 0.1 % EX CREA: 1.0000 "application " | TOPICAL_CREAM | Freq: Two times a day (BID) | CUTANEOUS | Status: DC

## 2014-06-23 MED ORDER — AMOXICILLIN-POT CLAVULANATE 875-125 MG PO TABS: 1.0000 | ORAL_TABLET | Freq: Two times a day (BID) | ORAL | Status: AC

## 2014-06-23 NOTE — Progress Notes (Signed)
   Subjective:    Patient ID: Tyler BerkshireDaniel Brock, male    DOB: 07-Sep-1991, 23 y.o.   MRN: [id number]  Otalgia  Associated symptoms include headaches. Pertinent negatives include no sore throat.   Left ear ache. Onset Saturday. Had some sinus congestion and cough. No fever. Possibly some mild loss of hearing in the left ear since he's had the pain. He's had prior tympanostomy tubes in childhood. Intermittent mild headaches. Also requesting refill of his Vyvanse for ADD   Review of Systems  Constitutional: Positive for fatigue. Negative for fever and chills.  HENT: Positive for ear pain. Negative for sore throat.   Neurological: Positive for headaches.       Objective:   Physical Exam  Constitutional: He appears well-developed and well-nourished.  HENT:  Right Ear: External ear normal.  Left eardrum is erythematous with slightly distorted landmarks. Oropharynx is clear.  No visible perforation  Neck: Neck supple.  Cardiovascular: Normal rate.   Pulmonary/Chest: Effort normal and breath sounds normal. No respiratory distress. He has no wheezes. He has no rales.  Lymphadenopathy:    He has no cervical adenopathy.          Assessment & Plan:  Acute left otitis media. Augmentin 875 mg twice daily for 10 days. Tylenol or Motrin for symptom relief.

## 2014-06-23 NOTE — Patient Instructions (Signed)
Otitis Media Otitis media is redness, soreness, and swelling (inflammation) of the middle ear. Otitis media may be caused by allergies or, most commonly, by infection. Often it occurs as a complication of the common cold. SIGNS AND SYMPTOMS Symptoms of otitis media may include:  Earache.  Fever.  Ringing in your ear.  Headache.  Leakage of fluid from the ear. DIAGNOSIS To diagnose otitis media, your health care provider will examine your ear with an otoscope. This is an instrument that allows your health care provider to see into your ear in order to examine your eardrum. Your health care provider also will ask you questions about your symptoms. TREATMENT  Typically, otitis media resolves on its own within 3-5 days. Your health care provider may prescribe medicine to ease your symptoms of pain. If otitis media does not resolve within 5 days or is recurrent, your health care provider may prescribe antibiotic medicines if he or she suspects that a bacterial infection is the cause. HOME CARE INSTRUCTIONS   Take your medicine as directed until it is gone, even if you feel better after the first few days.  Only take over-the-counter or prescription medicines for pain, discomfort, or fever as directed by your health care provider.  Follow up with your health care provider as directed. SEEK MEDICAL CARE IF:  You have otitis media only in one ear, or bleeding from your nose, or both.  You notice a lump on your neck.  You are not getting better in 3-5 days.  You feel worse instead of better. SEEK IMMEDIATE MEDICAL CARE IF:   You have pain that is not controlled with medicine.  You have swelling, redness, or pain around your ear or stiffness in your neck.  You notice that part of your face is paralyzed.  You notice that the bone behind your ear (mastoid) is tender when you touch it. MAKE SURE YOU:   Understand these instructions.  Will watch your condition.  Will get help right  away if you are not doing well or get worse. Document Released: 08/26/2004 Document Revised: 11/26/2013 Document Reviewed: 06/18/2013 Exit[employer] Patient Information 2015 [employer]. This information is not intended to replace advice given to you by your health care provider. Make sure you discuss any questions you have with your health care provider.  

## 2014-06-23 NOTE — Progress Notes (Signed)
Pre visit review using our clinic review tool, if applicable. No additional management support is needed unless otherwise documented below in the visit note. 

## 2014-09-02 ENCOUNTER — Emergency Department (HOSPITAL_COMMUNITY)
Admission: EM | Admit: 2014-09-02 | Discharge: 2014-09-02 | Disposition: A | Discharge status: Home / Self Care | Payer: BC Managed Care – PPO | Attending: Emergency Medicine | Admitting: Emergency Medicine

## 2014-09-02 ENCOUNTER — Emergency Department (HOSPITAL_COMMUNITY): Payer: BC Managed Care – PPO

## 2014-09-02 ENCOUNTER — Encounter (HOSPITAL_COMMUNITY): Payer: Self-pay | Admitting: Emergency Medicine

## 2014-09-02 DIAGNOSIS — F988 Other specified behavioral and emotional disorders with onset usually occurring in childhood and adolescence: Secondary | ICD-10-CM | POA: Insufficient documentation

## 2014-09-02 DIAGNOSIS — S71109A Unspecified open wound, unspecified thigh, initial encounter: Secondary | ICD-10-CM | POA: Insufficient documentation

## 2014-09-02 DIAGNOSIS — J45909 Unspecified asthma, uncomplicated: Secondary | ICD-10-CM | POA: Insufficient documentation

## 2014-09-02 DIAGNOSIS — Z862 Personal history of diseases of the blood and blood-forming organs and certain disorders involving the immune mechanism: Secondary | ICD-10-CM | POA: Diagnosis not present

## 2014-09-02 DIAGNOSIS — Z79899 Other long term (current) drug therapy: Secondary | ICD-10-CM | POA: Insufficient documentation

## 2014-09-02 DIAGNOSIS — Z8719 Personal history of other diseases of the digestive system: Secondary | ICD-10-CM | POA: Insufficient documentation

## 2014-09-02 DIAGNOSIS — Y9389 Activity, other specified: Secondary | ICD-10-CM | POA: Diagnosis not present

## 2014-09-02 DIAGNOSIS — S81009A Unspecified open wound, unspecified knee, initial encounter: Secondary | ICD-10-CM | POA: Insufficient documentation

## 2014-09-02 DIAGNOSIS — Z8639 Personal history of other endocrine, nutritional and metabolic disease: Secondary | ICD-10-CM | POA: Insufficient documentation

## 2014-09-02 DIAGNOSIS — S71009A Unspecified open wound, unspecified hip, initial encounter: Secondary | ICD-10-CM | POA: Insufficient documentation

## 2014-09-02 DIAGNOSIS — IMO0002 Reserved for concepts with insufficient information to code with codable children: Secondary | ICD-10-CM | POA: Diagnosis not present

## 2014-09-02 DIAGNOSIS — S81809A Unspecified open wound, unspecified lower leg, initial encounter: Principal | ICD-10-CM

## 2014-09-02 DIAGNOSIS — Y9289 Other specified places as the place of occurrence of the external cause: Secondary | ICD-10-CM | POA: Insufficient documentation

## 2014-09-02 DIAGNOSIS — S91009A Unspecified open wound, unspecified ankle, initial encounter: Principal | ICD-10-CM

## 2014-09-02 DIAGNOSIS — T07XXXA Unspecified multiple injuries, initial encounter: Secondary | ICD-10-CM

## 2014-09-02 MED ORDER — ONDANSETRON 8 MG PO TBDP
8.0000 mg | ORAL_TABLET | Freq: Once | ORAL | Status: AC
  Administered 2014-09-02: 8 mg via ORAL

## 2014-09-02 MED ORDER — HYDROCODONE-ACETAMINOPHEN 5-325 MG PO TABS
ORAL_TABLET | ORAL | Status: DC
Start: 2014-09-02 — End: 2014-09-02
  Filled 2014-09-02: qty 1

## 2014-09-02 MED ORDER — LIDOCAINE-EPINEPHRINE (PF) 1 %-1:200000 IJ SOLN
INTRAMUSCULAR | Status: AC
  Filled 2014-09-02: qty 10

## 2014-09-02 MED ORDER — POVIDONE-IODINE 10 % EX SOLN
CUTANEOUS | Status: AC
  Filled 2014-09-02: qty 118

## 2014-09-02 MED ORDER — HYDROCODONE-ACETAMINOPHEN 5-325 MG PO TABS: 1.0000 | ORAL_TABLET | Freq: Four times a day (QID) | ORAL | Status: DC | PRN

## 2014-09-02 MED ORDER — HYDROCODONE-ACETAMINOPHEN 5-325 MG PO TABS
1.0000 | ORAL_TABLET | Freq: Once | ORAL | Status: AC
  Administered 2014-09-02: 1 via ORAL

## 2014-09-02 MED ORDER — LIDOCAINE-EPINEPHRINE (PF) 2 %-1:200000 IJ SOLN
INTRAMUSCULAR | Status: AC
  Filled 2014-09-02: qty 20

## 2014-09-02 MED ORDER — CEPHALEXIN 500 MG PO CAPS: 500.0000 mg | ORAL_CAPSULE | Freq: Four times a day (QID) | ORAL | Status: DC

## 2014-09-02 MED ORDER — HYDROCODONE-ACETAMINOPHEN 5-325 MG PO TABS
2.0000 | ORAL_TABLET | Freq: Once | ORAL | Status: AC
  Administered 2014-09-02: 2 via ORAL
  Filled 2014-09-02: qty 2

## 2014-09-02 MED ORDER — LIDOCAINE HCL (PF) 2 % IJ SOLN
INTRAMUSCULAR | Status: AC
  Filled 2014-09-02: qty 10

## 2014-09-02 MED ORDER — CEPHALEXIN 500 MG PO CAPS
1000.0000 mg | ORAL_CAPSULE | Freq: Once | ORAL | Status: AC
  Administered 2014-09-02: 1000 mg via ORAL
  Filled 2014-09-02: qty 2

## 2014-09-02 MED ORDER — ONDANSETRON 8 MG PO TBDP
ORAL_TABLET | ORAL | Status: AC
  Filled 2014-09-02: qty 1
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
## 2014-09-02 NOTE — ED Notes (Signed)
Pt gone to xray
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
## 2014-09-02 NOTE — ED Notes (Signed)
Wounds to bilateral legs cleaned out with warm saline

## 2014-09-02 NOTE — ED Notes (Signed)
Dr. Read DriversMolpus in with pt suturing wounds
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
## 2014-09-03 ENCOUNTER — Telehealth: Payer: Self-pay | Admitting: Family Medicine

## 2014-09-03 ENCOUNTER — Encounter (HOSPITAL_COMMUNITY): Payer: Self-pay | Admitting: Emergency Medicine

## 2014-09-03 ENCOUNTER — Inpatient Hospital Stay (HOSPITAL_COMMUNITY): Payer: BC Managed Care – PPO

## 2014-09-03 ENCOUNTER — Emergency Department (HOSPITAL_COMMUNITY): Payer: BC Managed Care – PPO

## 2014-09-03 ENCOUNTER — Inpatient Hospital Stay (HOSPITAL_COMMUNITY)
Admission: EM | Admit: 2014-09-03 | Discharge: 2014-09-09 | DRG: 853 | Disposition: A | Discharge status: Home Health | Payer: BC Managed Care – PPO | Attending: Internal Medicine | Admitting: Internal Medicine

## 2014-09-03 DIAGNOSIS — R6520 Severe sepsis without septic shock: Secondary | ICD-10-CM | POA: Diagnosis not present

## 2014-09-03 DIAGNOSIS — L03115 Cellulitis of right lower limb: Secondary | ICD-10-CM | POA: Diagnosis present

## 2014-09-03 DIAGNOSIS — R6521 Severe sepsis with septic shock: Secondary | ICD-10-CM | POA: Diagnosis present

## 2014-09-03 DIAGNOSIS — A419 Sepsis, unspecified organism: Secondary | ICD-10-CM | POA: Diagnosis not present

## 2014-09-03 DIAGNOSIS — L02419 Cutaneous abscess of limb, unspecified: Secondary | ICD-10-CM

## 2014-09-03 DIAGNOSIS — F688 Other specified disorders of adult personality and behavior: Secondary | ICD-10-CM | POA: Diagnosis present

## 2014-09-03 DIAGNOSIS — D696 Thrombocytopenia, unspecified: Secondary | ICD-10-CM | POA: Diagnosis not present

## 2014-09-03 DIAGNOSIS — E878 Other disorders of electrolyte and fluid balance, not elsewhere classified: Secondary | ICD-10-CM | POA: Diagnosis present

## 2014-09-03 DIAGNOSIS — N170 Acute kidney failure with tubular necrosis: Secondary | ICD-10-CM | POA: Diagnosis present

## 2014-09-03 DIAGNOSIS — K759 Inflammatory liver disease, unspecified: Secondary | ICD-10-CM | POA: Diagnosis present

## 2014-09-03 DIAGNOSIS — D62 Acute posthemorrhagic anemia: Secondary | ICD-10-CM | POA: Diagnosis present

## 2014-09-03 DIAGNOSIS — R197 Diarrhea, unspecified: Secondary | ICD-10-CM | POA: Diagnosis not present

## 2014-09-03 DIAGNOSIS — L03032 Cellulitis of left toe: Secondary | ICD-10-CM

## 2014-09-03 DIAGNOSIS — L0390 Cellulitis, unspecified: Secondary | ICD-10-CM | POA: Diagnosis present

## 2014-09-03 DIAGNOSIS — Z8249 Family history of ischemic heart disease and other diseases of the circulatory system: Secondary | ICD-10-CM | POA: Diagnosis not present

## 2014-09-03 DIAGNOSIS — E860 Dehydration: Secondary | ICD-10-CM | POA: Diagnosis present

## 2014-09-03 DIAGNOSIS — L500 Allergic urticaria: Secondary | ICD-10-CM

## 2014-09-03 DIAGNOSIS — E872 Acidosis: Secondary | ICD-10-CM | POA: Diagnosis present

## 2014-09-03 DIAGNOSIS — R739 Hyperglycemia, unspecified: Secondary | ICD-10-CM | POA: Diagnosis present

## 2014-09-03 DIAGNOSIS — L03119 Cellulitis of unspecified part of limb: Secondary | ICD-10-CM | POA: Diagnosis not present

## 2014-09-03 DIAGNOSIS — J45909 Unspecified asthma, uncomplicated: Secondary | ICD-10-CM | POA: Diagnosis present

## 2014-09-03 DIAGNOSIS — M726 Necrotizing fasciitis: Secondary | ICD-10-CM | POA: Diagnosis present

## 2014-09-03 DIAGNOSIS — E871 Hypo-osmolality and hyponatremia: Secondary | ICD-10-CM | POA: Diagnosis present

## 2014-09-03 DIAGNOSIS — Z809 Family history of malignant neoplasm, unspecified: Secondary | ICD-10-CM | POA: Diagnosis not present

## 2014-09-03 DIAGNOSIS — B9620 Unspecified Escherichia coli [E. coli] as the cause of diseases classified elsewhere: Secondary | ICD-10-CM | POA: Diagnosis present

## 2014-09-03 LAB — URINALYSIS, ROUTINE W REFLEX MICROSCOPIC
Bilirubin Urine: NEGATIVE
GLUCOSE, UA: NEGATIVE mg/dL
KETONES UR: NEGATIVE mg/dL
LEUKOCYTES UA: NEGATIVE
NITRITE: NEGATIVE
PH: 5.5 (ref 5.0–8.0)
Protein, ur: 30 mg/dL — AB
SPECIFIC GRAVITY, URINE: 1.016 (ref 1.005–1.030)
Urobilinogen, UA: 0.2 mg/dL (ref 0.0–1.0)

## 2014-09-03 LAB — CBC WITH DIFFERENTIAL/PLATELET
Basophils Absolute: 0 10*3/uL (ref 0.0–0.1)
Basophils Relative: 0 % (ref 0–1)
EOS ABS: 0 10*3/uL (ref 0.0–0.7)
Eosinophils Relative: 0 % (ref 0–5)
HCT: 38.2 % — ABNORMAL LOW (ref 39.0–52.0)
Hemoglobin: 13.8 g/dL (ref 13.0–17.0)
LYMPHS PCT: 3 % — AB (ref 12–46)
Lymphs Abs: 0.5 10*3/uL — ABNORMAL LOW (ref 0.7–4.0)
MCH: 30.8 pg (ref 26.0–34.0)
MCHC: 36.1 g/dL — AB (ref 30.0–36.0)
MCV: 85.3 fL (ref 78.0–100.0)
MONO ABS: 0.8 10*3/uL (ref 0.1–1.0)
Monocytes Relative: 5 % (ref 3–12)
NEUTROS PCT: 92 % — AB (ref 43–77)
Neutro Abs: 15.3 10*3/uL — ABNORMAL HIGH (ref 1.7–7.7)
Platelets: 123 10*3/uL — ABNORMAL LOW (ref 150–400)
RBC: 4.48 MIL/uL (ref 4.22–5.81)
RDW: 12.3 % (ref 11.5–15.5)
WBC: 16.6 10*3/uL — ABNORMAL HIGH (ref 4.0–10.5)

## 2014-09-03 LAB — GLUCOSE, CAPILLARY: Glucose-Capillary: 81 mg/dL (ref 70–99)

## 2014-09-03 LAB — CBG MONITORING, ED: GLUCOSE-CAPILLARY: 100 mg/dL — AB (ref 70–99)

## 2014-09-03 LAB — COMPREHENSIVE METABOLIC PANEL
ALBUMIN: 3.4 g/dL — AB (ref 3.5–5.2)
ALT: 28 U/L (ref 0–53)
AST: 42 U/L — ABNORMAL HIGH (ref 0–37)
Alkaline Phosphatase: 68 U/L (ref 39–117)
Anion gap: 17 — ABNORMAL HIGH (ref 5–15)
BUN: 31 mg/dL — ABNORMAL HIGH (ref 6–23)
CALCIUM: 8.3 mg/dL — AB (ref 8.4–10.5)
CO2: 20 mEq/L (ref 19–32)
CREATININE: 2.25 mg/dL — AB (ref 0.50–1.35)
Chloride: 96 mEq/L (ref 96–112)
GFR calc Af Amer: 45 mL/min — ABNORMAL LOW (ref >90)
GFR calc non Af Amer: 39 mL/min — ABNORMAL LOW (ref >90)
Glucose, Bld: 112 mg/dL — ABNORMAL HIGH (ref 70–99)
Potassium: 4 mEq/L (ref 3.7–5.3)
Sodium: 133 mEq/L — ABNORMAL LOW (ref 137–147)
TOTAL PROTEIN: 6.5 g/dL (ref 6.0–8.3)
Total Bilirubin: 3.6 mg/dL — ABNORMAL HIGH (ref 0.3–1.2)

## 2014-09-03 LAB — CBC
HEMATOCRIT: 30.3 % — AB (ref 39.0–52.0)
Hemoglobin: 10.9 g/dL — ABNORMAL LOW (ref 13.0–17.0)
MCH: 30.9 pg (ref 26.0–34.0)
MCHC: 36 g/dL (ref 30.0–36.0)
MCV: 85.8 fL (ref 78.0–100.0)
Platelets: 92 10*3/uL — ABNORMAL LOW (ref 150–400)
RBC: 3.53 MIL/uL — AB (ref 4.22–5.81)
RDW: 12.4 % (ref 11.5–15.5)
WBC: 8.6 10*3/uL (ref 4.0–10.5)

## 2014-09-03 LAB — URINE MICROSCOPIC-ADD ON

## 2014-09-03 LAB — I-STAT CG4 LACTIC ACID, ED: LACTIC ACID, VENOUS: 2.53 mmol/L — AB (ref 0.5–2.2)

## 2014-09-03 LAB — PROTIME-INR
INR: 1.78 — ABNORMAL HIGH (ref 0.00–1.49)
Prothrombin Time: 20.7 seconds — ABNORMAL HIGH (ref 11.6–15.2)

## 2014-09-03 LAB — APTT: aPTT: 33 seconds (ref 24–37)

## 2014-09-03 MED ORDER — FENTANYL CITRATE 0.05 MG/ML IJ SOLN
25.0000 ug | Freq: Once | INTRAMUSCULAR | Status: AC
  Administered 2014-09-03: 25 ug via INTRAVENOUS
  Filled 2014-09-03: qty 2

## 2014-09-03 MED ORDER — VANCOMYCIN HCL IN DEXTROSE 750-5 MG/150ML-% IV SOLN
750.0000 mg | Freq: Two times a day (BID) | INTRAVENOUS | Status: DC
  Administered 2014-09-04: 750 mg via INTRAVENOUS
  Filled 2014-09-03 (×2): qty 150

## 2014-09-03 MED ORDER — SODIUM CHLORIDE 0.9 % IV BOLUS (SEPSIS)
1000.0000 mL | Freq: Once | INTRAVENOUS | Status: AC
  Administered 2014-09-03: 1000 mL via INTRAVENOUS

## 2014-09-03 MED ORDER — SODIUM CHLORIDE 0.9 % IV BOLUS (SEPSIS): 1000.0000 mL | INTRAVENOUS | Status: DC | PRN

## 2014-09-03 MED ORDER — ENOXAPARIN SODIUM 40 MG/0.4ML ~~LOC~~ SOLN: 40.0000 mg | SUBCUTANEOUS | Status: DC

## 2014-09-03 MED ORDER — SODIUM CHLORIDE 0.9 % IV SOLN: 1000.0000 mL | INTRAVENOUS | Status: DC

## 2014-09-03 MED ORDER — POTASSIUM CHLORIDE IN NACL 20-0.9 MEQ/L-% IV SOLN
INTRAVENOUS | Status: DC
  Administered 2014-09-03 – 2014-09-04 (×2): 150 mL/h via INTRAVENOUS
  Filled 2014-09-03 (×4): qty 1000

## 2014-09-03 MED ORDER — NOREPINEPHRINE BITARTRATE 1 MG/ML IV SOLN
2.0000 ug/min | INTRAVENOUS | Status: DC
  Administered 2014-09-03: 2 ug/min via INTRAVENOUS
  Filled 2014-09-03: qty 4

## 2014-09-03 MED ORDER — HYDROMORPHONE HCL 1 MG/ML IJ SOLN: 0.5000 mg | INTRAMUSCULAR | Status: DC | PRN

## 2014-09-03 MED ORDER — ONDANSETRON HCL 4 MG/2ML IJ SOLN
4.0000 mg | Freq: Once | INTRAMUSCULAR | Status: AC
  Administered 2014-09-03: 4 mg via INTRAVENOUS
  Filled 2014-09-03: qty 2

## 2014-09-03 MED ORDER — SODIUM CHLORIDE 0.9 % IV SOLN
INTRAVENOUS | Status: DC
  Administered 2014-09-04: 05:00:00 via INTRAVENOUS

## 2014-09-03 MED ORDER — SODIUM CHLORIDE 0.9 % IV BOLUS (SEPSIS): 30.0000 mL/kg | Freq: Once | INTRAVENOUS | Status: DC

## 2014-09-03 MED ORDER — PIPERACILLIN-TAZOBACTAM 3.375 G IVPB 30 MIN
3.3750 g | Freq: Once | INTRAVENOUS | Status: AC
  Administered 2014-09-03: 3.375 g via INTRAVENOUS
  Filled 2014-09-03: qty 50

## 2014-09-03 MED ORDER — VANCOMYCIN HCL IN DEXTROSE 1-5 GM/200ML-% IV SOLN
1000.0000 mg | Freq: Once | INTRAVENOUS | Status: AC
  Administered 2014-09-03: 1000 mg via INTRAVENOUS
  Filled 2014-09-03: qty 200

## 2014-09-03 MED ORDER — SODIUM CHLORIDE 0.9 % IV SOLN
1000.0000 mL | INTRAVENOUS | Status: DC
  Administered 2014-09-03: 1000 mL via INTRAVENOUS

## 2014-09-03 MED ORDER — ONDANSETRON HCL 4 MG/2ML IJ SOLN
4.0000 mg | Freq: Four times a day (QID) | INTRAMUSCULAR | Status: DC | PRN
  Administered 2014-09-03 – 2014-09-09 (×4): 4 mg via INTRAVENOUS
  Filled 2014-09-03 (×5): qty 2

## 2014-09-03 MED ORDER — PANTOPRAZOLE SODIUM 40 MG IV SOLR
40.0000 mg | Freq: Every day | INTRAVENOUS | Status: DC
  Administered 2014-09-04: 40 mg via INTRAVENOUS
  Filled 2014-09-03 (×2): qty 40

## 2014-09-03 MED ORDER — SODIUM CHLORIDE 0.9 % IV SOLN: 250.0000 mL | INTRAVENOUS | Status: DC | PRN

## 2014-09-03 MED ORDER — ONDANSETRON HCL 8 MG PO TABS: 8.0000 mg | ORAL_TABLET | Freq: Three times a day (TID) | ORAL | Status: DC | PRN

## 2014-09-03 MED ORDER — SODIUM CHLORIDE 0.9 % IV BOLUS (SEPSIS)
30.0000 mL/kg | Freq: Once | INTRAVENOUS | Status: AC
  Administered 2014-09-03: 2700 mL via INTRAVENOUS

## 2014-09-03 MED ORDER — METRONIDAZOLE IN NACL 5-0.79 MG/ML-% IV SOLN: 500.0000 mg | Freq: Once | INTRAVENOUS | Status: DC

## 2014-09-03 MED ORDER — HEPARIN SODIUM (PORCINE) 5000 UNIT/ML IJ SOLN: 5000.0000 [IU] | Freq: Three times a day (TID) | INTRAMUSCULAR | Status: DC

## 2014-09-03 MED ORDER — PANTOPRAZOLE SODIUM 40 MG PO TBEC: 40.0000 mg | DELAYED_RELEASE_TABLET | Freq: Every day | ORAL | Status: DC

## 2014-09-03 MED ORDER — HYDROMORPHONE HCL 1 MG/ML IJ SOLN: 1.0000 mg | INTRAMUSCULAR | Status: DC | PRN

## 2014-09-03 MED ORDER — PIPERACILLIN-TAZOBACTAM 3.375 G IVPB
3.3750 g | Freq: Three times a day (TID) | INTRAVENOUS | Status: DC
  Administered 2014-09-04 – 2014-09-07 (×11): 3.375 g via INTRAVENOUS
  Filled 2014-09-03 (×13): qty 50

## 2014-09-03 MED ORDER — ACETAMINOPHEN 500 MG PO TABS
1000.0000 mg | ORAL_TABLET | Freq: Once | ORAL | Status: AC
  Administered 2014-09-03: 1000 mg via ORAL
  Filled 2014-09-03: qty 2

## 2014-09-03 MED ORDER — HYDROMORPHONE HCL 1 MG/ML IJ SOLN
1.0000 mg | INTRAMUSCULAR | Status: DC | PRN
  Administered 2014-09-03: 1 mg via INTRAVENOUS
  Filled 2014-09-03: qty 1

## 2014-09-03 MED ORDER — ONDANSETRON HCL 4 MG PO TABS
4.0000 mg | ORAL_TABLET | Freq: Four times a day (QID) | ORAL | Status: DC | PRN
  Administered 2014-09-08: 4 mg via ORAL
  Filled 2014-09-03: qty 1

## 2014-09-03 NOTE — Progress Notes (Signed)
Patient ID: Tyler BerkshireDaniel Brock, male   DOB: 07-18-1991, 23 y.o.   MRN: [id number]  Patient remains hypotensive. I think critical care needs to evaluate and admit the patient.  No other obvious trauma to explain his hypotension.

## 2014-09-03 NOTE — ED Notes (Signed)
Pt c/o nausea. Dr. Criss AlvineGoldston at bedside and aware.
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
## 2014-09-03 NOTE — ED Notes (Signed)
Dr. Criss AlvineGoldston states to hold Levophed at this time.
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
## 2014-09-03 NOTE — ED Notes (Signed)
Attempted Report 
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
## 2014-09-03 NOTE — ED Notes (Signed)
Attempted report 

## 2014-09-03 NOTE — Telephone Encounter (Signed)
Took call from Irma at call a RN at 10:35 AM. Will relay to Dr Caryl NeverBurchette, advised her direct pt to ER as originally advised.

## 2014-09-03 NOTE — ED Notes (Signed)
Pt BP noted to be significantly lower at 70/26.  Dr. Criss AlvineGoldston made aware and at bedside. Pt placed in modified trendelenberg, alert and oriented x4.

## 2014-09-03 NOTE — ED Notes (Addendum)
Dr. Criss AlvineGoldston called regarding pt.  Made aware of low bp.

## 2014-09-03 NOTE — ED Notes (Signed)
NOTIFIED DR. GOLDSTON IN PERSON FOR PATIENTS PANIC LAB RESULTS OF CG4+ LACTIC ACID = 2.53 mmol/L ,@17 :42 PM ,09/03/2014.
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
## 2014-09-03 NOTE — Telephone Encounter (Signed)
Patient Information:  Caller Name: Tyler Brock  Phone: [phone]  Patient: Tyler Brock, Tyler Brock  Gender: Male  DOB: [birth year]  Age: [age] Years  PCP: Tyler Brock, [facility](Family Practice)  Office Follow Up:  Does the office need to follow up with this patient?: Yes  Instructions For The Office: Please f/u with mom, thank you.  RN Note:  Per mom's request contacted the office and spoke with Marchelle FolksAmanda, reviewed note from this AM in chart and advised per Marchelle FolksAmanda to have mom take pt to ED as instructed by ED on discharge.  Also advised mom Dr Caryl NeverBurchette will return her call when he is available. Mom verbalized understanding.  Symptoms  Reason For Call & Symptoms: Momstates she called this morning and requested a call back from provider and has not received call back at this [id number].  Pt's mom/Tyler Brock is very upset she had to wait 20 minures and would like Dr Caryl NeverBurchette call her back at this number [phone] so that she can tell him about the incompetence.  Mom also states pt is worse this morning and since she has not received call back will transport [facility].  Reviewed Health History In EMR: Yes  Reviewed Medications In EMR: Yes  Reviewed Allergies In EMR: Yes  Reviewed Surgeries / Procedures: Yes  Date of Onset of Symptoms: 09/03/2014  Guideline(s) Used:  No Protocol Available - Sick Adult  Disposition Per Guideline:   Discuss with PCP and Callback by Nurse Today  Reason For Disposition Reached:   Nursing judgment  Advice Given:  Call Back If:  New symptoms develop  You become worse.  Patient Refused Recommendation:  Patient Refused Care Advice  Pt's mom/Tyler Brock would like to take to provider.

## 2014-09-03 NOTE — ED Notes (Signed)
MD at bedside. 
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
## 2014-09-03 NOTE — ED Notes (Signed)
Attemped report 

## 2014-09-03 NOTE — Telephone Encounter (Signed)
Zofran 8 mg po q 8 hr prn nausea/vomiting #12 with no refills. ER follow up if nausea not controlled with that.
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
## 2014-09-03 NOTE — ED Notes (Signed)
Dr. Goldston at bedside.  

## 2014-09-03 NOTE — Telephone Encounter (Signed)
RX sent to  Pharmacy. Pt mother is aware.

## 2014-09-03 NOTE — ED Notes (Signed)
[facility]. Windy KalataYasemia RN and Dr. Criss AlvineGoldston at bedside at this time.

## 2014-09-04 ENCOUNTER — Inpatient Hospital Stay (HOSPITAL_COMMUNITY): Payer: BC Managed Care – PPO | Admitting: Anesthesiology

## 2014-09-04 ENCOUNTER — Encounter (HOSPITAL_COMMUNITY): Payer: BC Managed Care – PPO | Admitting: Anesthesiology

## 2014-09-04 ENCOUNTER — Encounter (HOSPITAL_COMMUNITY)
Admission: EM | Disposition: A | Discharge status: Home Health | Payer: Self-pay | Source: Home / Self Care | Attending: Internal Medicine

## 2014-09-04 ENCOUNTER — Inpatient Hospital Stay (HOSPITAL_COMMUNITY): Payer: BC Managed Care – PPO

## 2014-09-04 DIAGNOSIS — R609 Edema, unspecified: Secondary | ICD-10-CM

## 2014-09-04 DIAGNOSIS — R6521 Severe sepsis with septic shock: Secondary | ICD-10-CM

## 2014-09-04 DIAGNOSIS — A419 Sepsis, unspecified organism: Principal | ICD-10-CM

## 2014-09-04 DIAGNOSIS — M726 Necrotizing fasciitis: Secondary | ICD-10-CM | POA: Diagnosis present

## 2014-09-04 HISTORY — PX: I & D EXTREMITY: SHX5045

## 2014-09-04 LAB — URINE CULTURE
CULTURE: NO GROWTH
Colony Count: NO GROWTH

## 2014-09-04 LAB — BASIC METABOLIC PANEL
Anion gap: 10 (ref 5–15)
BUN: 22 mg/dL (ref 6–23)
CO2: 21 mEq/L (ref 19–32)
Calcium: 7.5 mg/dL — ABNORMAL LOW (ref 8.4–10.5)
Chloride: 111 mEq/L (ref 96–112)
Creatinine, Ser: 1.62 mg/dL — ABNORMAL HIGH (ref 0.50–1.35)
GFR, EST AFRICAN AMERICAN: 67 mL/min — AB (ref >90)
GFR, EST NON AFRICAN AMERICAN: 58 mL/min — AB (ref >90)
Glucose, Bld: 106 mg/dL — ABNORMAL HIGH (ref 70–99)
POTASSIUM: 4.6 meq/L (ref 3.7–5.3)
Sodium: 142 mEq/L (ref 137–147)

## 2014-09-04 LAB — LACTIC ACID, PLASMA
LACTIC ACID, VENOUS: 1.1 mmol/L (ref 0.5–2.2)
Lactic Acid, Venous: 1.3 mmol/L (ref 0.5–2.2)
Lactic Acid, Venous: 2.3 mmol/L — ABNORMAL HIGH (ref 0.5–2.2)

## 2014-09-04 LAB — POCT I-STAT 4, (NA,K, GLUC, HGB,HCT)
Glucose, Bld: 103 mg/dL — ABNORMAL HIGH (ref 70–99)
HEMATOCRIT: 33 % — AB (ref 39.0–52.0)
Hemoglobin: 11.2 g/dL — ABNORMAL LOW (ref 13.0–17.0)
POTASSIUM: 4 meq/L (ref 3.7–5.3)
Sodium: 141 mEq/L (ref 137–147)

## 2014-09-04 LAB — URINE DRUGS OF ABUSE SCREEN W ALC, ROUTINE (REF LAB)
Amphetamine Screen, Ur: NEGATIVE
BENZODIAZEPINES.: NEGATIVE
Barbiturate Quant, Ur: NEGATIVE
COCAINE METABOLITES: NEGATIVE
CREATININE, U: 82.8 mg/dL
Marijuana Metabolite: NEGATIVE
Methadone: NEGATIVE
OPIATE SCREEN, URINE: NEGATIVE
PHENCYCLIDINE (PCP): NEGATIVE
Propoxyphene: NEGATIVE

## 2014-09-04 LAB — DIC (DISSEMINATED INTRAVASCULAR COAGULATION) PANEL
Platelets: 109 10*3/uL — ABNORMAL LOW (ref 150–400)
SMEAR REVIEW: NONE SEEN
aPTT: 28 seconds (ref 24–37)

## 2014-09-04 LAB — CBC
HCT: 29.8 % — ABNORMAL LOW (ref 39.0–52.0)
HEMATOCRIT: 32.3 % — AB (ref 39.0–52.0)
HEMOGLOBIN: 11.6 g/dL — AB (ref 13.0–17.0)
Hemoglobin: 10.5 g/dL — ABNORMAL LOW (ref 13.0–17.0)
MCH: 30.6 pg (ref 26.0–34.0)
MCH: 30.3 pg (ref 26.0–34.0)
MCHC: 35.2 g/dL (ref 30.0–36.0)
MCHC: 35.9 g/dL (ref 30.0–36.0)
MCV: 84.3 fL (ref 78.0–100.0)
MCV: 86.9 fL (ref 78.0–100.0)
PLATELETS: 109 10*3/uL — AB (ref 150–400)
Platelets: 107 10*3/uL — ABNORMAL LOW (ref 150–400)
RBC: 3.43 MIL/uL — ABNORMAL LOW (ref 4.22–5.81)
RBC: 3.83 MIL/uL — ABNORMAL LOW (ref 4.22–5.81)
RDW: 12.5 % (ref 11.5–15.5)
RDW: 12.7 % (ref 11.5–15.5)
WBC: 10.9 10*3/uL — ABNORMAL HIGH (ref 4.0–10.5)
WBC: 11 10*3/uL — ABNORMAL HIGH (ref 4.0–10.5)

## 2014-09-04 LAB — TYPE AND SCREEN
ABO/RH(D): B NEG
Antibody Screen: NEGATIVE

## 2014-09-04 LAB — GRAM STAIN

## 2014-09-04 LAB — CORTISOL: CORTISOL PLASMA: 31.1 ug/dL

## 2014-09-04 LAB — DIC (DISSEMINATED INTRAVASCULAR COAGULATION)PANEL
D-Dimer, Quant: 2.24 ug/mL-FEU — ABNORMAL HIGH (ref 0.00–0.48)
Fibrinogen: 644 mg/dL — ABNORMAL HIGH (ref 204–475)
INR: 1.48 (ref 0.00–1.49)
Prothrombin Time: 17.9 seconds — ABNORMAL HIGH (ref 11.6–15.2)

## 2014-09-04 LAB — CREATININE, SERUM
Creatinine, Ser: 1.93 mg/dL — ABNORMAL HIGH (ref 0.50–1.35)
GFR calc Af Amer: 54 mL/min — ABNORMAL LOW (ref >90)
GFR calc non Af Amer: 47 mL/min — ABNORMAL LOW (ref >90)

## 2014-09-04 LAB — MRSA PCR SCREENING: MRSA BY PCR: NEGATIVE

## 2014-09-04 LAB — APTT: aPTT: 36 seconds (ref 24–37)

## 2014-09-04 LAB — ABO/RH: ABO/RH(D): B NEG

## 2014-09-04 LAB — FIBRINOGEN: Fibrinogen: 545 mg/dL — ABNORMAL HIGH (ref 204–475)

## 2014-09-04 SURGERY — IRRIGATION AND DEBRIDEMENT EXTREMITY
Anesthesia: General | Site: Leg Lower | Laterality: Right

## 2014-09-04 MED ORDER — SODIUM CHLORIDE 0.9 % IJ SOLN: 9.0000 mL | INTRAMUSCULAR | Status: DC | PRN

## 2014-09-04 MED ORDER — VANCOMYCIN HCL IN DEXTROSE 1-5 GM/200ML-% IV SOLN
1000.0000 mg | Freq: Three times a day (TID) | INTRAVENOUS | Status: DC
  Administered 2014-09-04 – 2014-09-05 (×4): 1000 mg via INTRAVENOUS
  Filled 2014-09-04 (×7): qty 200

## 2014-09-04 MED ORDER — ONDANSETRON HCL 4 MG/2ML IJ SOLN
INTRAMUSCULAR | Status: DC | PRN
  Administered 2014-09-04: 4 mg via INTRAVENOUS

## 2014-09-04 MED ORDER — HEPARIN SODIUM (PORCINE) 5000 UNIT/ML IJ SOLN
5000.0000 [IU] | Freq: Three times a day (TID) | INTRAMUSCULAR | Status: DC
  Administered 2014-09-04 – 2014-09-09 (×13): 5000 [IU] via SUBCUTANEOUS
  Filled 2014-09-04 (×19): qty 1

## 2014-09-04 MED ORDER — CLINDAMYCIN PHOSPHATE 600 MG/50ML IV SOLN
600.0000 mg | Freq: Three times a day (TID) | INTRAVENOUS | Status: AC
  Administered 2014-09-04 – 2014-09-06 (×8): 600 mg via INTRAVENOUS
  Filled 2014-09-04 (×11): qty 50

## 2014-09-04 MED ORDER — ONDANSETRON HCL 4 MG/2ML IJ SOLN
4.0000 mg | Freq: Four times a day (QID) | INTRAMUSCULAR | Status: DC | PRN
  Administered 2014-09-05: 4 mg via INTRAVENOUS
  Filled 2014-09-04: qty 2

## 2014-09-04 MED ORDER — FENTANYL CITRATE 0.05 MG/ML IJ SOLN
INTRAMUSCULAR | Status: AC
  Filled 2014-09-04: qty 5

## 2014-09-04 MED ORDER — NEOSTIGMINE METHYLSULFATE 10 MG/10ML IV SOLN
INTRAVENOUS | Status: AC
  Filled 2014-09-04: qty 1

## 2014-09-04 MED ORDER — PROPOFOL 10 MG/ML IV BOLUS
INTRAVENOUS | Status: AC
  Filled 2014-09-04: qty 20

## 2014-09-04 MED ORDER — LIDOCAINE HCL (CARDIAC) 20 MG/ML IV SOLN
INTRAVENOUS | Status: DC | PRN
  Administered 2014-09-04: 100 mg via INTRAVENOUS

## 2014-09-04 MED ORDER — DEXAMETHASONE SODIUM PHOSPHATE 4 MG/ML IJ SOLN
INTRAMUSCULAR | Status: DC | PRN
  Administered 2014-09-04: 8 mg via INTRAVENOUS

## 2014-09-04 MED ORDER — FENTANYL CITRATE 0.05 MG/ML IJ SOLN
INTRAMUSCULAR | Status: AC
  Filled 2014-09-04: qty 2

## 2014-09-04 MED ORDER — ROCURONIUM BROMIDE 50 MG/5ML IV SOLN
INTRAVENOUS | Status: AC
  Filled 2014-09-04: qty 1

## 2014-09-04 MED ORDER — HYDROMORPHONE 0.3 MG/ML IV SOLN
INTRAVENOUS | Status: DC
  Administered 2014-09-04: 0.6 mg via INTRAVENOUS
  Administered 2014-09-04: 1.2 mg via INTRAVENOUS
  Administered 2014-09-04: 0.6 mg via INTRAVENOUS
  Administered 2014-09-04: 09:00:00 via INTRAVENOUS
  Administered 2014-09-05: 1.95 mg via INTRAVENOUS
  Administered 2014-09-05: 1.5 mg via INTRAVENOUS
  Administered 2014-09-05: 3 mg via INTRAVENOUS
  Filled 2014-09-04 (×2): qty 25

## 2014-09-04 MED ORDER — DIPHENHYDRAMINE HCL 50 MG/ML IJ SOLN: 12.5000 mg | Freq: Four times a day (QID) | INTRAMUSCULAR | Status: DC | PRN

## 2014-09-04 MED ORDER — LIDOCAINE HCL (CARDIAC) 20 MG/ML IV SOLN
INTRAVENOUS | Status: AC
  Filled 2014-09-04: qty 5

## 2014-09-04 MED ORDER — DEXAMETHASONE SODIUM PHOSPHATE 4 MG/ML IJ SOLN
INTRAMUSCULAR | Status: AC
  Filled 2014-09-04: qty 2

## 2014-09-04 MED ORDER — NOREPINEPHRINE BITARTRATE 1 MG/ML IV SOLN
4000.0000 ug | INTRAVENOUS | Status: DC | PRN
  Administered 2014-09-04: 8 ug/min via INTRAVENOUS

## 2014-09-04 MED ORDER — DIPHENHYDRAMINE HCL 12.5 MG/5ML PO ELIX
12.5000 mg | ORAL_SOLUTION | Freq: Four times a day (QID) | ORAL | Status: DC | PRN
  Filled 2014-09-04: qty 5

## 2014-09-04 MED ORDER — FENTANYL CITRATE 0.05 MG/ML IJ SOLN
25.0000 ug | INTRAMUSCULAR | Status: DC | PRN
  Administered 2014-09-04 (×2): 50 ug via INTRAVENOUS

## 2014-09-04 MED ORDER — CETYLPYRIDINIUM CHLORIDE 0.05 % MT LIQD
7.0000 mL | Freq: Two times a day (BID) | OROMUCOSAL | Status: DC
  Administered 2014-09-04 (×2): 7 mL via OROMUCOSAL

## 2014-09-04 MED ORDER — MORPHINE SULFATE 2 MG/ML IJ SOLN
2.0000 mg | INTRAMUSCULAR | Status: DC | PRN
  Administered 2014-09-04: 4 mg via INTRAVENOUS
  Administered 2014-09-05: 2 mg via INTRAVENOUS
  Administered 2014-09-05: 4 mg via INTRAVENOUS
  Filled 2014-09-04: qty 1
  Filled 2014-09-04 (×2): qty 2

## 2014-09-04 MED ORDER — FENTANYL CITRATE 0.05 MG/ML IJ SOLN
INTRAMUSCULAR | Status: DC | PRN
  Administered 2014-09-04 (×2): 50 ug via INTRAVENOUS
  Administered 2014-09-04 (×3): 100 ug via INTRAVENOUS

## 2014-09-04 MED ORDER — SUCCINYLCHOLINE CHLORIDE 20 MG/ML IJ SOLN
INTRAMUSCULAR | Status: DC | PRN
  Administered 2014-09-04: 140 mg via INTRAVENOUS

## 2014-09-04 MED ORDER — LACTATED RINGERS IV SOLN
INTRAVENOUS | Status: DC | PRN
  Administered 2014-09-04 (×2): via INTRAVENOUS

## 2014-09-04 MED ORDER — NALOXONE HCL 0.4 MG/ML IJ SOLN: 0.4000 mg | INTRAMUSCULAR | Status: DC | PRN

## 2014-09-04 MED ORDER — ONDANSETRON HCL 4 MG/2ML IJ SOLN
INTRAMUSCULAR | Status: AC
  Filled 2014-09-04: qty 2

## 2014-09-04 MED ORDER — GLYCOPYRROLATE 0.2 MG/ML IJ SOLN
INTRAMUSCULAR | Status: AC
  Filled 2014-09-04: qty 1

## 2014-09-04 MED ORDER — PROPOFOL 10 MG/ML IV BOLUS
INTRAVENOUS | Status: DC | PRN
  Administered 2014-09-04: 150 mg via INTRAVENOUS

## 2014-09-04 MED ORDER — SODIUM CHLORIDE 0.45 % IV SOLN
INTRAVENOUS | Status: DC
  Administered 2014-09-04: 75 mL/h via INTRAVENOUS

## 2014-09-04 MED ORDER — SUCCINYLCHOLINE CHLORIDE 20 MG/ML IJ SOLN
INTRAMUSCULAR | Status: AC
  Filled 2014-09-04: qty 1

## 2014-09-04 SURGICAL SUPPLY — 70 items
BANDAGE ELASTIC 3 VELCRO ST LF (GAUZE/BANDAGES/DRESSINGS) IMPLANT
BLADE SURG 10 STRL SS (BLADE) ×2 IMPLANT
BNDG COHESIVE 1X5 TAN STRL LF (GAUZE/BANDAGES/DRESSINGS) IMPLANT
BNDG COHESIVE 4X5 TAN STRL (GAUZE/BANDAGES/DRESSINGS) IMPLANT
BNDG COHESIVE 6X5 TAN STRL LF (GAUZE/BANDAGES/DRESSINGS) ×4 IMPLANT
BNDG CONFORM 3 STRL LF (GAUZE/BANDAGES/DRESSINGS) IMPLANT
BNDG GAUZE STRTCH 6 (GAUZE/BANDAGES/DRESSINGS) IMPLANT
CANISTER WOUND CARE 500ML ATS (WOUND CARE) ×2 IMPLANT
CONNECTOR Y ATS VAC SYSTEM (MISCELLANEOUS) ×2 IMPLANT
CONT SPEC 4OZ CLIKSEAL STRL BL (MISCELLANEOUS) ×2 IMPLANT
CORDS BIPOLAR (ELECTRODE) IMPLANT
COVER SURGICAL LIGHT HANDLE (MISCELLANEOUS) ×2 IMPLANT
CUFF TOURNIQUET SINGLE 24IN (TOURNIQUET CUFF) IMPLANT
CUFF TOURNIQUET SINGLE 34IN LL (TOURNIQUET CUFF) IMPLANT
CUFF TOURNIQUET SINGLE 44IN (TOURNIQUET CUFF) IMPLANT
DRAPE EXTREMITY BILATERAL (DRAPE) IMPLANT
DRAPE IMP U-DRAPE 54X76 (DRAPES) ×2 IMPLANT
DRAPE INCISE IOBAN 66X45 STRL (DRAPES) ×8 IMPLANT
DRAPE SURG 17X23 STRL (DRAPES) IMPLANT
DRAPE U-SHAPE 47X51 STRL (DRAPES) ×4 IMPLANT
DRAPE U-SHAPE 76X120 STRL (DRAPES) ×4 IMPLANT
DRSG VAC ATS MED SENSATRAC (GAUZE/BANDAGES/DRESSINGS) ×2 IMPLANT
DURAPREP 26ML APPLICATOR (WOUND CARE) ×2 IMPLANT
ELECT CAUTERY BLADE 6.4 (BLADE) ×2 IMPLANT
ELECT REM PT RETURN 9FT ADLT (ELECTROSURGICAL)
ELECTRODE REM PT RTRN 9FT ADLT (ELECTROSURGICAL) IMPLANT
FACESHIELD WRAPAROUND (MASK) ×2 IMPLANT
GAUZE SPONGE 4X4 12PLY STRL (GAUZE/BANDAGES/DRESSINGS) ×4 IMPLANT
GAUZE XEROFORM 1X8 LF (GAUZE/BANDAGES/DRESSINGS) ×2 IMPLANT
GAUZE XEROFORM 5X9 LF (GAUZE/BANDAGES/DRESSINGS) IMPLANT
GLOVE BIO SURGEON STRL SZ7.5 (GLOVE) ×4 IMPLANT
GLOVE BIO SURGEON STRL SZ8 (GLOVE) ×2 IMPLANT
GLOVE BIOGEL PI IND STRL 7.5 (GLOVE) ×2 IMPLANT
GLOVE BIOGEL PI INDICATOR 7.5 (GLOVE) ×2
GLOVE SURG SYN 7.5  E (GLOVE) ×2
GLOVE SURG SYN 7.5 E (GLOVE) ×2 IMPLANT
GOWN STRL REIN XL XLG (GOWN DISPOSABLE) ×2 IMPLANT
GOWN STRL REUS W/ TWL LRG LVL3 (GOWN DISPOSABLE) ×1 IMPLANT
GOWN STRL REUS W/TWL LRG LVL3 (GOWN DISPOSABLE) ×1
HANDPIECE INTERPULSE COAX TIP (DISPOSABLE)
IV NS IRRIG 3000ML ARTHROMATIC (IV SOLUTION) ×8 IMPLANT
KIT BASIN OR (CUSTOM PROCEDURE TRAY) ×2 IMPLANT
KIT ROOM TURNOVER OR (KITS) ×2 IMPLANT
MANIFOLD NEPTUNE II (INSTRUMENTS) ×2 IMPLANT
NS IRRIG 1000ML POUR BTL (IV SOLUTION) ×4 IMPLANT
PACK ORTHO EXTREMITY (CUSTOM PROCEDURE TRAY) ×2 IMPLANT
PAD ABD 8X10 STRL (GAUZE/BANDAGES/DRESSINGS) ×2 IMPLANT
PAD ARMBOARD 7.5X6 YLW CONV (MISCELLANEOUS) ×4 IMPLANT
PAD NEG PRESSURE SENSATRAC (MISCELLANEOUS) ×2 IMPLANT
PADDING CAST ABS 4INX4YD NS (CAST SUPPLIES)
PADDING CAST ABS COTTON 4X4 ST (CAST SUPPLIES) IMPLANT
PADDING CAST COTTON 6X4 STRL (CAST SUPPLIES) IMPLANT
SET HNDPC FAN SPRY TIP SCT (DISPOSABLE) IMPLANT
SPONGE LAP 18X18 X RAY DECT (DISPOSABLE) ×6 IMPLANT
STOCKINETTE IMPERVIOUS 9X36 MD (GAUZE/BANDAGES/DRESSINGS) IMPLANT
SUT ETHILON 2 0 FS 18 (SUTURE) ×6 IMPLANT
SUT ETHILON 2 0 PSLX (SUTURE) IMPLANT
SUT ETHILON 3 0 PS 1 (SUTURE) IMPLANT
SUT VIC AB 2-0 CT1 36 (SUTURE) ×2 IMPLANT
SUT VIC AB 2-0 FS1 27 (SUTURE) IMPLANT
SYR CONTROL 10ML LL (SYRINGE) IMPLANT
TOWEL OR 17X24 6PK STRL BLUE (TOWEL DISPOSABLE) ×2 IMPLANT
TOWEL OR 17X26 10 PK STRL BLUE (TOWEL DISPOSABLE) ×2 IMPLANT
TUBE ANAEROBIC SPECIMEN COL (MISCELLANEOUS) ×8 IMPLANT
TUBE CONNECTING 12X1/4 (SUCTIONS) ×2 IMPLANT
TUBE FEEDING 5FR 15 INCH (TUBING) IMPLANT
TUBING CYSTO DISP (UROLOGICAL SUPPLIES) ×2 IMPLANT
UNDERPAD 30X30 INCONTINENT (UNDERPADS AND DIAPERS) ×4 IMPLANT
WATER STERILE IRR 1000ML POUR (IV SOLUTION) ×2 IMPLANT
YANKAUER SUCT BULB TIP NO VENT (SUCTIONS) ×2 IMPLANT
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
## 2014-09-04 NOTE — Progress Notes (Signed)
Discussed CT findings with radiologist.  Findings are c/w nec fasc.  Will plan for emergent surgery.

## 2014-09-04 NOTE — Anesthesia Preprocedure Evaluation (Addendum)
Anesthesia Evaluation  Patient identified by MRN, date of birth, ID band Patient awake    Reviewed: Allergy & Precautions, H&P , NPO status , Patient's Chart, lab work & pertinent test results  History of Anesthesia Complications Negative for: history of anesthetic complications  Airway Mallampati: II TM Distance: >3 FB Neck ROM: Full    Dental  (+) Teeth Intact, Dental Advisory Given   Pulmonary  Childhood asthma         Cardiovascular negative cardio ROS      Neuro/Psych negative psych ROS   GI/Hepatic negative GI ROS, (+) Hepatitis -  Endo/Other  negative endocrine ROS  Renal/GU ARF from septic shock     Musculoskeletal negative musculoskeletal ROS (+)   Abdominal   Peds  Hematology negative hematology ROS (+)   Anesthesia Other Findings   Reproductive/Obstetrics                         Anesthesia Physical Anesthesia Plan  ASA: III and emergent  Anesthesia Plan: General   Post-op Pain Management:    Induction: Intravenous  Airway Management Planned: Oral ETT  Additional Equipment:   Intra-op Plan:   Post-operative Plan: Extubation in OR  Informed Consent: I have reviewed the patients History and Physical, chart, labs and discussed the procedure including the risks, benefits and alternatives for the proposed anesthesia with the patient or authorized representative who has indicated his/her understanding and acceptance.   Dental advisory given  Plan Discussed with: CRNA, Anesthesiologist and Surgeon  Anesthesia Plan Comments:        Anesthesia Quick Evaluation

## 2014-09-04 NOTE — Progress Notes (Addendum)
   Subjective:  Patient reports pain as severe.  Overall, he feels better  Objective:   VITALS:   Filed Vitals:   09/04/14 0700 09/04/14 0715 09/04/14 0730 09/04/14 0738  BP: 107/58  113/59   Pulse: 105 109 108   Temp:    98.1 F (36.7 C)  TempSrc:    Oral  Resp: 17 23 24    Height:      Weight:      SpO2: 95% 97% 89%     Afebrile VAC with good seal and suction 2+ pulses Cellulitis no worse   Lab Results  Component Value Date   WBC 10.9* 09/04/2014   HGB 10.5* 09/04/2014   HCT 29.8* 09/04/2014   MCV 86.9 09/04/2014   PLT 109* 09/04/2014   PLT 109* 09/04/2014     Assessment/Plan:  Day of Surgery   - Expected postop acute blood loss anemia - will monitor for symptoms - bedrest - scheduled for repeat I&D tomorrow - NPO after midnight - recommend ID to weigh in on treatment    Cheral AlmasXu, Vianny Schraeder Michael 09/04/2014, 8:12 AM [phone]

## 2014-09-04 NOTE — Op Note (Addendum)
Date of surgery: 09/04/2014  Preoperative diagnosis: 1. Right lower leg necrotizing fasciitis 2. Right thigh necrotizing fasciitis  Postoperative diagnosis: Same  Procedure:  1. Incision and and drainage of right lower leg necrotizing fasciitis 2. Debridement of skin, subcutaneous tissue, fascia, muscle of right lower leg 18 cm x 9 cm x 2 cm deep 3. Fasciotomy of anterior muscular compartment and lateral compartment 4. Incision and drainage of right thigh necrotizing fasciitis 5. debridement of skin, subcutaneous tissue of right thigh 8 cm x 5 cm x 1 cm deep 6. Application of negative wound therapy greater than 50 cm  Surgeon: Glee ArvinMichael Xu, M.D.  Anesthesia: Gen.  Estimated blood loss 200 cc  Complications: None  Specimens: 3 fluid cultures and one tissue culture  Indications for procedure: The patient is a 23 year old gentleman with signs and symptoms of necrotizing fasciitis in septic shock presents for emergent surgical treatment of necrotizing fasciitis of the right lower extremity. The risks, benefits, and alternatives to surgery were discussed with the patient and the family and they elected to proceed.  Description of procedure: The patient was identified in the preoperative holding area. The operative sites were marked by the surgeon and confirmed by the patient. He is brought back to the operating room. His placed supine on the table. General anesthesia was induced. The right lower extremity was prepped and draped in standard sterile fashion. A timeout was performed. He was given IV antibiotics at his regularly scheduled intervals. We first began with the ankle portion of the surgery. Incision and drainage of the right lateral ankle and lower leg was performed. There was a large amount of dishwater like fluid along with frank pus. There was also a significant amount of necrotic subcutaneous tissue and skin.  This was all sharply debrided using a knife. I then performed extensive  sharp excisional debridement of the skin, subcutaneous tissue, muscle, fascia. 4 cultures were taken in this area. The majority of the infection appeared to be limited to the subcutaneous level. However there were signs of early fascial involvement that penetrated deep to the fascia and into the anterior and lateral muscular compartment. Fasciotomies of those compartments were performed and muscle was debrided using a rongeur.  Everything was debrided back to a healthy level with good bleeding tissue. Once this was done 6 L of normal saline was irrigated through the wound using cystoscopy tubing. The wound measurements were as stated above. A wound VAC was placed on the lateral aspect of the ankle and leg. We then turned our attention to the thigh. Again incision and drainage of the right thigh was performed. There was a moderate amount of frank pus and dishwater like fluid. Sharp excisional debridement of the skin, subcutaneous tissue was performed using a knife and rongeur. This was debrided back to a healthy and bleeding tissue.  The wound measured as stated above. 6 L of normal saline was then irrigated through the thigh wound. A wound VAC was also placed in this wound. The patient tolerated the procedure well and was extubated and transferred to the PACU in stable condition.  Postoperative plan: The patient will require a return trip to the operating room on Friday for another look. He is to remain on broad-spectrum antibiotics until speciation of the intraoperative cultures.    [name]. Michael Xu, MD [facility] [phone] 4:42 AM
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
## 2014-09-04 NOTE — Plan of Care (Signed)
Problem: Phase I Progression Outcomes Goal: Pain controlled with appropriate interventions Outcome: Not Progressing Pt in constant pain, worse after surgery per his report.  Pt will begin PCA pump this am to help control pain. Goal: Vital signs/hemodynamically stable Outcome: Progressing Pt is off of vasopressors and BP and HR have maintained within normal range since his return from surgery.

## 2014-09-04 NOTE — Progress Notes (Signed)
Cellulitis stable + warmth Pain improved Afebrile Plan for repeat I&D in am  N. Glee ArvinMichael Xu, MD [facility] [phone] 5:29 PM
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
## 2014-09-04 NOTE — Progress Notes (Signed)
Utilization review completed. Gwenevere Goga, RN, BSN. 

## 2014-09-04 NOTE — Anesthesia Procedure Notes (Signed)
Procedure Name: Intubation Date/Time: 09/04/2014 3:03 AM Performed by: Nykolas Bacallao S Pre-anesthesia Checklist: Patient identified, Timeout performed, Emergency Drugs available, Suction available and Patient being monitored Patient Re-evaluated:Patient Re-evaluated prior to inductionOxygen Delivery Method: Circle system utilized Preoxygenation: Pre-oxygenation with 100% oxygen Intubation Type: IV induction, Rapid sequence and Cricoid Pressure applied Ventilation: Mask ventilation without difficulty Laryngoscope Size: Mac and 4 Grade View: Grade I Tube type: Oral Tube size: 7.5 mm Number of attempts: 1 Airway Equipment and Method: Stylet Placement Confirmation: ETT inserted through vocal cords under direct vision,  positive ETCO2 and breath sounds checked- equal and bilateral Secured at: 23 cm Tube secured with: Tape Dental Injury: Teeth and Oropharynx as per pre-operative assessment

## 2014-09-04 NOTE — Transfer of Care (Signed)
Immediate Anesthesia Transfer of Care Note  Patient: Tyler BerkshireDaniel Brock  Procedure(s) Performed: Procedure(s): IRRIGATION AND DEBRIDEMENT RIGHT LEG (Right)  Patient Location: PACU  Anesthesia Type:General  Level of Consciousness: awake, alert  and oriented  Airway & Oxygen Therapy: Patient Spontanous Breathing and Patient connected to face mask oxygen  Post-op Assessment: Report given to PACU RN and Post -op Vital signs reviewed and stable  Post vital signs: Reviewed and stable  Complications: No apparent anesthesia complications

## 2014-09-04 NOTE — Progress Notes (Signed)
VASCULAR LAB PRELIMINARY  PRELIMINARY  PRELIMINARY  PRELIMINARY  Right lower extremity venous Doppler completed.    Preliminary report:  There is no DVT or SVT noted in the right lower extremity.  Winefred Hillesheim, RVT 09/04/2014, 11:09 AM

## 2014-09-05 ENCOUNTER — Encounter (HOSPITAL_COMMUNITY)
Admission: EM | Disposition: A | Discharge status: Home Health | Payer: Self-pay | Source: Home / Self Care | Attending: Internal Medicine

## 2014-09-05 ENCOUNTER — Inpatient Hospital Stay (HOSPITAL_COMMUNITY): Payer: BC Managed Care – PPO | Admitting: Anesthesiology

## 2014-09-05 ENCOUNTER — Encounter (HOSPITAL_COMMUNITY): Payer: BC Managed Care – PPO | Admitting: Anesthesiology

## 2014-09-05 DIAGNOSIS — M726 Necrotizing fasciitis: Secondary | ICD-10-CM

## 2014-09-05 DIAGNOSIS — L03032 Cellulitis of left toe: Secondary | ICD-10-CM

## 2014-09-05 HISTORY — PX: APPLICATION OF WOUND VAC: SHX5189

## 2014-09-05 HISTORY — PX: I & D EXTREMITY: SHX5045

## 2014-09-05 LAB — COMPREHENSIVE METABOLIC PANEL
ALK PHOS: 61 U/L (ref 39–117)
ALT: 19 U/L (ref 0–53)
AST: 24 U/L (ref 0–37)
Albumin: 2.2 g/dL — ABNORMAL LOW (ref 3.5–5.2)
Anion gap: 9 (ref 5–15)
BUN: 14 mg/dL (ref 6–23)
CO2: 24 mEq/L (ref 19–32)
Calcium: 8.1 mg/dL — ABNORMAL LOW (ref 8.4–10.5)
Chloride: 103 mEq/L (ref 96–112)
Creatinine, Ser: 1.12 mg/dL (ref 0.50–1.35)
GFR calc non Af Amer: >90 mL/min (ref >90)
GLUCOSE: 121 mg/dL — AB (ref 70–99)
Potassium: 4.1 mEq/L (ref 3.7–5.3)
SODIUM: 136 meq/L — AB (ref 137–147)
TOTAL PROTEIN: 5.4 g/dL — AB (ref 6.0–8.3)
Total Bilirubin: 1.3 mg/dL — ABNORMAL HIGH (ref 0.3–1.2)

## 2014-09-05 LAB — CBC WITH DIFFERENTIAL/PLATELET
BASOS PCT: 0 % (ref 0–1)
Basophils Absolute: 0 10*3/uL (ref 0.0–0.1)
EOS ABS: 0 10*3/uL (ref 0.0–0.7)
Eosinophils Relative: 0 % (ref 0–5)
HEMATOCRIT: 29.9 % — AB (ref 39.0–52.0)
HEMOGLOBIN: 10.4 g/dL — AB (ref 13.0–17.0)
Lymphocytes Relative: 4 % — ABNORMAL LOW (ref 12–46)
Lymphs Abs: 0.6 10*3/uL — ABNORMAL LOW (ref 0.7–4.0)
MCH: 30.1 pg (ref 26.0–34.0)
MCHC: 34.8 g/dL (ref 30.0–36.0)
MCV: 86.4 fL (ref 78.0–100.0)
MONO ABS: 0.8 10*3/uL (ref 0.1–1.0)
MONOS PCT: 5 % (ref 3–12)
NEUTROS PCT: 91 % — AB (ref 43–77)
Neutro Abs: 13.4 10*3/uL — ABNORMAL HIGH (ref 1.7–7.7)
Platelets: 132 10*3/uL — ABNORMAL LOW (ref 150–400)
RBC: 3.46 MIL/uL — ABNORMAL LOW (ref 4.22–5.81)
RDW: 12.6 % (ref 11.5–15.5)
WBC: 14.8 10*3/uL — ABNORMAL HIGH (ref 4.0–10.5)

## 2014-09-05 LAB — LACTIC ACID, PLASMA: Lactic Acid, Venous: 0.6 mmol/L (ref 0.5–2.2)

## 2014-09-05 SURGERY — IRRIGATION AND DEBRIDEMENT EXTREMITY
Anesthesia: General | Site: Leg Lower | Laterality: Right

## 2014-09-05 MED ORDER — VANCOMYCIN HCL IN DEXTROSE 1-5 GM/200ML-% IV SOLN
1000.0000 mg | Freq: Three times a day (TID) | INTRAVENOUS | Status: DC
  Administered 2014-09-06 – 2014-09-07 (×5): 1000 mg via INTRAVENOUS
  Filled 2014-09-05 (×7): qty 200

## 2014-09-05 MED ORDER — LACTATED RINGERS IV SOLN
INTRAVENOUS | Status: DC
  Administered 2014-09-05: 09:00:00 via INTRAVENOUS

## 2014-09-05 MED ORDER — DOCUSATE SODIUM 100 MG PO CAPS
100.0000 mg | ORAL_CAPSULE | Freq: Two times a day (BID) | ORAL | Status: DC | PRN
  Administered 2014-09-05: 100 mg via ORAL

## 2014-09-05 MED ORDER — FENTANYL CITRATE 0.05 MG/ML IJ SOLN
INTRAMUSCULAR | Status: DC | PRN
  Administered 2014-09-05: 50 ug via INTRAVENOUS

## 2014-09-05 MED ORDER — SUCCINYLCHOLINE CHLORIDE 20 MG/ML IJ SOLN
INTRAMUSCULAR | Status: DC | PRN
  Administered 2014-09-05: 100 mg via INTRAVENOUS

## 2014-09-05 MED ORDER — PROPOFOL 10 MG/ML IV BOLUS
INTRAVENOUS | Status: AC
  Filled 2014-09-05: qty 20

## 2014-09-05 MED ORDER — ONDANSETRON HCL 4 MG/2ML IJ SOLN
INTRAMUSCULAR | Status: DC | PRN
Start: 2014-09-05 — End: 2014-09-05
  Administered 2014-09-05: 4 mg via INTRAVENOUS

## 2014-09-05 MED ORDER — MIDAZOLAM HCL 2 MG/2ML IJ SOLN
INTRAMUSCULAR | Status: AC
  Filled 2014-09-05: qty 2

## 2014-09-05 MED ORDER — TRAZODONE HCL 100 MG PO TABS
100.0000 mg | ORAL_TABLET | Freq: Every evening | ORAL | Status: DC | PRN
  Filled 2014-09-05: qty 1

## 2014-09-05 MED ORDER — LIDOCAINE HCL (CARDIAC) 20 MG/ML IV SOLN
INTRAVENOUS | Status: AC
  Filled 2014-09-05: qty 5

## 2014-09-05 MED ORDER — GLYCOPYRROLATE 0.2 MG/ML IJ SOLN
INTRAMUSCULAR | Status: DC | PRN
  Administered 2014-09-05: 0.2 mg via INTRAVENOUS

## 2014-09-05 MED ORDER — SUCCINYLCHOLINE CHLORIDE 20 MG/ML IJ SOLN
INTRAMUSCULAR | Status: AC
  Filled 2014-09-05: qty 1

## 2014-09-05 MED ORDER — SENNA 8.6 MG PO TABS
1.0000 | ORAL_TABLET | Freq: Every day | ORAL | Status: DC | PRN
  Administered 2014-09-05: 8.6 mg via ORAL
  Filled 2014-09-05 (×2): qty 1

## 2014-09-05 MED ORDER — MORPHINE SULFATE 2 MG/ML IJ SOLN: 2.0000 mg | INTRAMUSCULAR | Status: DC | PRN

## 2014-09-05 MED ORDER — ARTIFICIAL TEARS OP OINT
TOPICAL_OINTMENT | OPHTHALMIC | Status: AC
  Filled 2014-09-05: qty 3.5

## 2014-09-05 MED ORDER — PROPOFOL 10 MG/ML IV BOLUS
INTRAVENOUS | Status: DC | PRN
  Administered 2014-09-05: 150 mg via INTRAVENOUS

## 2014-09-05 MED ORDER — PHENYLEPHRINE HCL 10 MG/ML IJ SOLN
INTRAMUSCULAR | Status: DC | PRN
  Administered 2014-09-05: 40 ug via INTRAVENOUS

## 2014-09-05 MED ORDER — FENTANYL CITRATE 0.05 MG/ML IJ SOLN
INTRAMUSCULAR | Status: AC
  Filled 2014-09-05: qty 5

## 2014-09-05 MED ORDER — FENTANYL CITRATE 0.05 MG/ML IJ SOLN: 25.0000 ug | INTRAMUSCULAR | Status: DC | PRN

## 2014-09-05 MED ORDER — PHENYLEPHRINE 40 MCG/ML (10ML) SYRINGE FOR IV PUSH (FOR BLOOD PRESSURE SUPPORT)
PREFILLED_SYRINGE | INTRAVENOUS | Status: AC
  Filled 2014-09-05: qty 10

## 2014-09-05 MED ORDER — OXYCODONE HCL 5 MG PO TABS
10.0000 mg | ORAL_TABLET | ORAL | Status: DC | PRN
  Administered 2014-09-05 – 2014-09-09 (×21): 10 mg via ORAL
  Filled 2014-09-05 (×21): qty 2

## 2014-09-05 MED ORDER — LIDOCAINE HCL (CARDIAC) 20 MG/ML IV SOLN
INTRAVENOUS | Status: DC | PRN
  Administered 2014-09-05: 60 mg via INTRAVENOUS

## 2014-09-05 MED ORDER — ONDANSETRON HCL 4 MG/2ML IJ SOLN
INTRAMUSCULAR | Status: AC
  Filled 2014-09-05: qty 2

## 2014-09-05 MED ORDER — SODIUM CHLORIDE 0.9 % IR SOLN
Status: DC | PRN
  Administered 2014-09-05 (×4): 3000 mL

## 2014-09-05 SURGICAL SUPPLY — 35 items
BANDAGE ELASTIC 3 VELCRO ST LF (GAUZE/BANDAGES/DRESSINGS) IMPLANT
BNDG CONFORM 3 STRL LF (GAUZE/BANDAGES/DRESSINGS) IMPLANT
COVER SURGICAL LIGHT HANDLE (MISCELLANEOUS) ×3 IMPLANT
DRAPE IMP U-DRAPE 54X76 (DRAPES) IMPLANT
DRAPE INCISE IOBAN 66X45 STRL (DRAPES) ×3 IMPLANT
DRAPE U-SHAPE 47X51 STRL (DRAPES) ×3 IMPLANT
DRSG ADAPTIC 3X8 NADH LF (GAUZE/BANDAGES/DRESSINGS) ×3 IMPLANT
DRSG VAC ATS MED SENSATRAC (GAUZE/BANDAGES/DRESSINGS) ×3 IMPLANT
ELECT CAUTERY BLADE 6.4 (BLADE) ×3 IMPLANT
ELECT REM PT RETURN 9FT ADLT (ELECTROSURGICAL)
ELECTRODE REM PT RTRN 9FT ADLT (ELECTROSURGICAL) IMPLANT
FACESHIELD WRAPAROUND (MASK) IMPLANT
GAUZE SPONGE 4X4 12PLY STRL (GAUZE/BANDAGES/DRESSINGS) ×6 IMPLANT
GLOVE SURG SYN 7.5  E (GLOVE) ×2
GLOVE SURG SYN 7.5 E (GLOVE) ×4 IMPLANT
GOWN STRL REIN XL XLG (GOWN DISPOSABLE) ×3 IMPLANT
GOWN STRL REUS W/ TWL LRG LVL3 (GOWN DISPOSABLE) ×2 IMPLANT
GOWN STRL REUS W/TWL LRG LVL3 (GOWN DISPOSABLE) ×1
KIT BASIN OR (CUSTOM PROCEDURE TRAY) ×3 IMPLANT
KIT ROOM TURNOVER OR (KITS) ×3 IMPLANT
MANIFOLD NEPTUNE II (INSTRUMENTS) ×3 IMPLANT
NS IRRIG 1000ML POUR BTL (IV SOLUTION) ×6 IMPLANT
PACK ORTHO EXTREMITY (CUSTOM PROCEDURE TRAY) ×3 IMPLANT
PAD ABD 8X10 STRL (GAUZE/BANDAGES/DRESSINGS) ×3 IMPLANT
PAD ARMBOARD 7.5X6 YLW CONV (MISCELLANEOUS) ×6 IMPLANT
PAD NEG PRESSURE SENSATRAC (MISCELLANEOUS) ×3 IMPLANT
SET CYSTO W/LG BORE CLAMP LF (SET/KITS/TRAYS/PACK) ×3 IMPLANT
SUT ETHILON 2 0 FS 18 (SUTURE) ×15 IMPLANT
SUT MON AB 2-0 CT1 36 (SUTURE) ×6 IMPLANT
SYR CONTROL 10ML LL (SYRINGE) IMPLANT
TOWEL OR 17X24 6PK STRL BLUE (TOWEL DISPOSABLE) ×3 IMPLANT
TOWEL OR 17X26 10 PK STRL BLUE (TOWEL DISPOSABLE) ×3 IMPLANT
UNDERPAD 30X30 INCONTINENT (UNDERPADS AND DIAPERS) ×9 IMPLANT
WATER STERILE IRR 1000ML POUR (IV SOLUTION) ×3 IMPLANT
YANKAUER SUCT BULB TIP NO VENT (SUCTIONS) ×3 IMPLANT

## 2014-09-05 NOTE — Addendum Note (Signed)
Addendum created 09/05/14 1557 by Marni GriffonKaren B Amariah Kierstead, CRNA   Modules edited: Charges VN
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
## 2014-09-05 NOTE — Progress Notes (Signed)
eLink Physician-Brief Progress Note Patient Name: Tyler BerkshireDaniel Maddux DOB: 1991/02/15 MRN: [id number]   Date of Service  09/05/2014  HPI/Events of Note  Patient requests d/c dilaudid > makes him too sleepy, nauseated  eICU Interventions  Oxycodone prn, morphine prn     Intervention Category Minor Interventions: Routine modifications to care plan (e.g. PRN medications for pain, fever)  MCQUAID, DOUGLAS 09/05/2014, 7:06 PM

## 2014-09-05 NOTE — Progress Notes (Signed)
Paged  PCCM.  To call [facility] - done. Pulmonary attending patient until tomorrow 0700.  To call back if no return call. Pulmonary  Re-paged.

## 2014-09-05 NOTE — H&P (Signed)
H&P update  The surgical history has been reviewed and remains accurate without interval change.  The patient was re-examined and patient's physiologic condition has not changed significantly in the last 30 days. The condition still exists that makes this procedure necessary. The treatment plan remains the same, without new options for care.  No new pharmacological allergies or types of therapy has been initiated that would change the plan or the appropriateness of the plan.  The patient and/or family understand the potential benefits and risks.  Tyler ReelN. Michael Tyler Sicard, MD 09/05/2014 9:02 AM

## 2014-09-05 NOTE — Op Note (Signed)
Date of surgery: 09/05/2014  Preoperative diagnosis:  1. Necrotizing fasciitis of right lower leg and ankle 2. Necrotizing fasciitis of right thigh  Postoperative diagnosis: Same  Procedure: 1. Debridement of skin, subcutaneous tissue, muscle and fascia of right lower leg 18 cm x 9 cm x 2 cm deep 2. Debridement of skin and subcutaneous tissue of right thigh 8 cm x 5 cm x 1 cm deep 3. Adjacent tissue rearrangement of right lower leg 10 cm x 2 cm 4. Adjacent tissue rearrangement of right thigh 2 cm x 2 cm 5. Application of negative wound therapy less than 50 cm  Surgeon: Glee ArvinMichael Zahi Plaskett, M.D.  Anesthesia: Gen.  Estimated blood loss: Minimal  Complications: None  Indications for procedure: The patient returns today for serial debridement of his necrotizing fasciitis of his right lower extremity. The risks, benefits, and alternatives to surgery were again discussed and he elected to proceed. Next  Description of procedure: Patient was identified in the preoperative holding area. The operative sites were marked by the surgeon and confirmed with the patient. He is brought back to the operating room. Supine on the table. General anesthesia was induced. Right lower extremity was prepped and draped in standard sterile fashion. We first began with the right lower leg and ankle portion of the procedure. Overall the wound exhibited red beefy healthy tissue. There were a few areas with tissue that appeared tenuous. Sharp excisional debridement of the wound bed was carried out using a knife and curet and rongeur. Once this was done 6 L of normal saline was irrigated through the wound. There is no signs of continued infection or necrosis. The distal 3-4 cm of the fibula was exposed with no adequate soft tissue coverage. Local tissue was advanced to help partially close the wound. The rest of the wound measuring approximately 7 cm x 7 cm was closed using a VAC.  We then turned our attention to the right thigh  area. Sharp excisional debridement of the skin was carried out. Overall the wound also appeared healthy with good bleeding tissue. Sharp excisional debridement of the skin and subcutaneous tissue of the right thigh was carried out using a knife and rongeur and curette. The wound was then irrigated with 6 L of normal saline. Local tissue was advanced to close the wound primarily. This was done using 2-0 Monocryl for the deep skin layer and 2-0 nylon for the skin. Sterile dressings were applied to the right thigh. The patient was extubated and transferred to the PACU in stable condition.  Disposition: The patient will return to the operating room on Monday for another look. If tissues are healthy at that time and we will plan for wound coverage.  [name]. Michael Lindley Hiney, MD [facility] [phone] 12:04 PM

## 2014-09-05 NOTE — Anesthesia Postprocedure Evaluation (Signed)
  Anesthesia Post-op Note  Patient: Tyler Brock  Procedure(s) Performed: Procedure(s): IRRIGATION AND DEBRIDEMENT RIGHT LEG (Right)  Patient Location: PACU  Anesthesia Type:General  Level of Consciousness: awake  Airway and Oxygen Therapy: Patient Spontanous Breathing  Post-op Pain: mild  Post-op Assessment: Post-op Vital signs reviewed  Post-op Vital Signs: Reviewed  Last Vitals:  Filed Vitals:   09/05/14 1335  BP:   Pulse:   Temp: 37 C  Resp:     Complications: No apparent anesthesia complications

## 2014-09-05 NOTE — Anesthesia Postprocedure Evaluation (Signed)
  Anesthesia Post-op Note  Patient: Tyler Brock  Procedure(s) Performed: Procedure(s): IRRIGATION AND DEBRIDEMENT EXTREMITY, Closure of right inner thigh wound (Right) APPLICATION OF WOUND VAC (Right)  Patient Location: PACU  Anesthesia Type:General  Level of Consciousness: awake  Airway and Oxygen Therapy: Patient Spontanous Breathing  Post-op Pain: mild  Post-op Assessment: Post-op Vital signs reviewed  Post-op Vital Signs: Reviewed  Last Vitals:  Filed Vitals:   09/05/14 1309  BP: 130/70  Pulse: 98  Temp:   Resp: 17    Complications: No apparent anesthesia complications

## 2014-09-05 NOTE — Transfer of Care (Signed)
Immediate Anesthesia Transfer of Care Note  Patient: Tyler BerkshireDaniel Brock  Procedure(s) Performed: Procedure(s): IRRIGATION AND DEBRIDEMENT EXTREMITY, Closure of right inner thigh wound (Right) APPLICATION OF WOUND VAC (Right)  Patient Location: PACU  Anesthesia Type:General  Level of Consciousness: awake, alert  and oriented  Airway & Oxygen Therapy: Patient Spontanous Breathing and Patient connected to face mask oxygen  Post-op Assessment: Report given to PACU RN and Post -op Vital signs reviewed and stable  Post vital signs: Reviewed and stable  Complications: No apparent anesthesia complications

## 2014-09-05 NOTE — Anesthesia Procedure Notes (Signed)
Procedure Name: Intubation Date/Time: 09/05/2014 10:32 AM Performed by: Darcey NoraJAMES, Tyler Brock Pre-anesthesia Checklist: Patient identified, Emergency Drugs available, Suction available and Patient being monitored Patient Re-evaluated:Patient Re-evaluated prior to inductionOxygen Delivery Method: Circle system utilized Preoxygenation: Pre-oxygenation with 100% oxygen Intubation Type: IV induction Ventilation: Mask ventilation without difficulty Laryngoscope Size: Mac and 4 Grade View: Grade I Tube type: Oral Tube size: 7.5 mm Number of attempts: 1 Airway Equipment and Method: Stylet Placement Confirmation: ETT inserted through vocal cords under direct vision,  breath sounds checked- equal and bilateral and positive ETCO2 Secured at: 21 (cm at teeth) cm Tube secured with: Tape Dental Injury: Teeth and Oropharynx as per pre-operative assessment

## 2014-09-05 NOTE — Anesthesia Preprocedure Evaluation (Addendum)
Anesthesia Evaluation  Patient identified by MRN, date of birth, ID band Patient awake  General Assessment Comment:With some sedation  Reviewed: Allergy & Precautions, H&P , NPO status , Patient's Chart, lab work & pertinent test results  Airway Mallampati: II TM Distance: >3 FB Neck ROM: Full    Dental  (+) Teeth Intact, Dental Advisory Given   Pulmonary          Cardiovascular     Neuro/Psych    GI/Hepatic   Endo/Other    Renal/GU      Musculoskeletal   Abdominal   Peds  Hematology   Anesthesia Other Findings Full beard  Reproductive/Obstetrics                          Anesthesia Physical Anesthesia Plan  ASA: II  Anesthesia Plan: General   Post-op Pain Management:    Induction: Intravenous  Airway Management Planned: Oral ETT  Additional Equipment:   Intra-op Plan:   Post-operative Plan:   Informed Consent:   Dental advisory given  Plan Discussed with:   Anesthesia Plan Comments:         Anesthesia Quick Evaluation

## 2014-09-05 NOTE — Progress Notes (Signed)
eLink Physician-Brief Progress Note Patient Name: Bethann BerkshireDaniel Roderick DOB: 1991/04/08 MRN: [id number]   Date of Service  09/05/2014  HPI/Events of Note  Transferred to floor  eICU Interventions  Cleaned up orders for floor     Intervention Category Intermediate Interventions: Infection - evaluation and management Minor Interventions: Routine modifications to care plan (e.g. PRN medications for pain, fever)  MCQUAID, DOUGLAS 09/05/2014, 5:44 PM

## 2014-09-05 NOTE — Progress Notes (Signed)
Dr Kendrick FriesMcQuaid answered with order clarification, etc.

## 2014-09-05 NOTE — Progress Notes (Signed)
Patient's Attending paged to clarify orders. On contact precautions per Infection Control nurse - "until cultures come back".   Received report from AD at 1500.

## 2014-09-06 LAB — VANCOMYCIN, TROUGH: Vancomycin Tr: 16 ug/mL (ref 10.0–20.0)

## 2014-09-06 MED ORDER — MAGNESIUM CITRATE PO SOLN
1.0000 | Freq: Once | ORAL | Status: AC
  Administered 2014-09-06: 1 via ORAL
  Filled 2014-09-06: qty 296
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
## 2014-09-06 NOTE — Progress Notes (Signed)
Subjective: 1 Day Post-Op Procedure(s) (LRB): IRRIGATION AND DEBRIDEMENT EXTREMITY, Closure of right inner thigh wound (Right) APPLICATION OF WOUND VAC (Right) Patient reports pain as mild and moderate.    Objective: Vital signs in last 24 hours: Temp:  [98 F (36.7 C)-101.3 F (38.5 C)] 98.8 F (37.1 C) (10/03 0521) Pulse Rate:  [65-134] 65 (10/03 0521) Resp:  [15-31] 18 (10/03 0521) BP: (114-152)/(60-79) 121/70 mmHg (10/03 0521) SpO2:  [85 %-99 %] 96 % (10/03 0521) Weight:  [103.738 kg (228 lb 11.2 oz)] 103.738 kg (228 lb 11.2 oz) (10/03 0521)  Intake/Output from previous day: 10/02 0701 - 10/03 0700 In: 1115 [P.O.:840; I.V.:275] Out: 2850 [Urine:2750; Drains:100] Intake/Output this shift: Total I/O In: 50 [IV Piggyback:50] Out: -    Recent Labs  09/03/14 2205 09/04/14 0013 09/04/14 0250 09/04/14 0500 09/05/14 0400  HGB 10.9* 11.6* 11.2* 10.5* 10.4*    Recent Labs  09/04/14 0500 09/05/14 0400  WBC 10.9* 14.8*  RBC 3.43* 3.46*  HCT 29.8* 29.9*  PLT 109*  109* 132*    Recent Labs  09/04/14 0500 09/05/14 0400  NA 142 136*  K 4.6 4.1  CL 111 103  CO2 21 24  BUN 22 14  CREATININE 1.62* 1.12  GLUCOSE 106* 121*  CALCIUM 7.5* 8.1*    Recent Labs  09/03/14 2205 09/04/14 0500  INR 1.78* 1.48    Incision: dressing C/D/I  Assessment/Plan: 1 Day Post-Op Procedure(s) (LRB): IRRIGATION AND DEBRIDEMENT EXTREMITY, Closure of right inner thigh wound (Right) APPLICATION OF WOUND VAC (Right) Possible return to OR on Monday for repeat I&D  PETRARCA,BRIAN 09/06/2014, 10:13 AM
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
## 2014-09-07 LAB — CBC WITH DIFFERENTIAL/PLATELET
BASOS ABS: 0.1 10*3/uL (ref 0.0–0.1)
Basophils Relative: 1 % (ref 0–1)
EOS ABS: 0.2 10*3/uL (ref 0.0–0.7)
Eosinophils Relative: 3 % (ref 0–5)
HEMATOCRIT: 32.4 % — AB (ref 39.0–52.0)
Hemoglobin: 11.6 g/dL — ABNORMAL LOW (ref 13.0–17.0)
LYMPHS ABS: 1.9 10*3/uL (ref 0.7–4.0)
Lymphocytes Relative: 26 % (ref 12–46)
MCH: 30.3 pg (ref 26.0–34.0)
MCHC: 35.8 g/dL (ref 30.0–36.0)
MCV: 84.6 fL (ref 78.0–100.0)
Monocytes Absolute: 1 10*3/uL (ref 0.1–1.0)
Monocytes Relative: 13 % — ABNORMAL HIGH (ref 3–12)
NEUTROS ABS: 4.2 10*3/uL (ref 1.7–7.7)
Neutrophils Relative %: 57 % (ref 43–77)
Platelets: 161 10*3/uL (ref 150–400)
RBC: 3.83 MIL/uL — ABNORMAL LOW (ref 4.22–5.81)
RDW: 12.5 % (ref 11.5–15.5)
WBC: 7.4 10*3/uL (ref 4.0–10.5)

## 2014-09-07 LAB — CULTURE, ROUTINE-ABSCESS

## 2014-09-07 LAB — TISSUE CULTURE

## 2014-09-07 LAB — HIV ANTIBODY (ROUTINE TESTING W REFLEX): HIV 1&2 Ab, 4th Generation: NONREACTIVE

## 2014-09-07 MED ORDER — SODIUM CHLORIDE 0.9 % IJ SOLN
10.0000 mL | INTRAMUSCULAR | Status: DC | PRN
  Administered 2014-09-07: 10 mL

## 2014-09-07 MED ORDER — CEFAZOLIN SODIUM-DEXTROSE 2-3 GM-% IV SOLR
2.0000 g | INTRAVENOUS | Status: AC
  Administered 2014-09-08: 2 g via INTRAVENOUS
  Filled 2014-09-07: qty 50

## 2014-09-07 MED ORDER — MUPIROCIN 2 % EX OINT
1.0000 "application " | TOPICAL_OINTMENT | Freq: Two times a day (BID) | CUTANEOUS | Status: DC
  Administered 2014-09-07 – 2014-09-08 (×3): 1 via NASAL
  Filled 2014-09-07: qty 22

## 2014-09-07 MED ORDER — SODIUM CHLORIDE 0.9 % IJ SOLN
10.0000 mL | INTRAMUSCULAR | Status: DC | PRN
  Administered 2014-09-09 (×2): 10 mL

## 2014-09-07 MED ORDER — DEXTROSE 5 % IV SOLN
2.0000 g | INTRAVENOUS | Status: DC
  Administered 2014-09-07 – 2014-09-09 (×2): 2 g via INTRAVENOUS
  Filled 2014-09-07 (×3): qty 2

## 2014-09-07 MED ORDER — CHLORHEXIDINE GLUCONATE 4 % EX LIQD
60.0000 mL | Freq: Once | CUTANEOUS | Status: DC
  Filled 2014-09-07: qty 60

## 2014-09-07 MED ORDER — SODIUM CHLORIDE 0.9 % IV SOLN: INTRAVENOUS | Status: DC

## 2014-09-07 NOTE — Progress Notes (Signed)
Peripherally Inserted Central Catheter/Midline Placement  The IV Nurse has discussed with the patient and/or persons authorized to consent for the patient, the purpose of this procedure and the potential benefits and risks involved with this procedure.  The benefits include less needle sticks, lab draws from the catheter and patient may be discharged home with the catheter.  Risks include, but not limited to, infection, bleeding, blood clot (thrombus formation), and puncture of an artery; nerve damage and irregular heat beat.  Alternatives to this procedure were also discussed.  PICC/Midline Placement Documentation  PICC / Midline Single Lumen 09/07/14 PICC Left Basilic 44 cm 3 cm (Active)  Indication for Insertion or Continuance of Line Home intravenous therapies (PICC only) 09/07/2014  5:01 PM  Exposed Catheter (cm) 3 cm 09/07/2014  5:01 PM  Site Assessment Clean;Dry;Intact 09/07/2014  5:01 PM  Line Status Flushed;Saline locked;Blood return noted 09/07/2014  5:01 PM  Dressing Type Transparent 09/07/2014  5:01 PM  Dressing Status Clean;Dry;Intact;Antimicrobial disc in place 09/07/2014  5:01 PM  Line Care Connections checked and tightened 09/07/2014  5:01 PM  Line Adjustment (NICU/IV Team Only) No 09/07/2014  5:01 PM  Dressing Intervention New dressing 09/07/2014  5:01 PM  Dressing Change Due 09/14/14 09/07/2014  5:01 PM       Elliot Dallyiggs, Tyra Michelle Wright 09/07/2014, 5:01 PM
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
## 2014-09-07 NOTE — Progress Notes (Addendum)
   Subjective:  Patient reports pain as moderate.  improved  Objective:   VITALS:   Filed Vitals:   09/06/14 1447 09/06/14 1955 09/07/14 0431 09/07/14 0550  BP: 121/66 140/85 128/76   Pulse: 91 93 98   Temp: 98.6 F (37 C) 99.6 F (37.6 C) 99.1 F (37.3 C) 99.9 F (37.7 C)  TempSrc: Oral Oral Oral Oral  Resp: 16 16    Height:      Weight:   94.212 kg (207 lb 11.2 oz)   SpO2: 97% 97% 93%     VAC in place Cellulitis improving LLE wound with soupy drainage, superficial, no cellulitis   Lab Results  Component Value Date   WBC 7.4 09/07/2014   HGB 11.6* 09/07/2014   HCT 32.4* 09/07/2014   MCV 84.6 09/07/2014   PLT 161 09/07/2014     Assessment/Plan:  2 Days Post-Op   - Expected postop acute blood loss anemia - will monitor for symptoms - Up with PT/OT - DVT ppx - SCDs, ambulation, heparin - NWB right lower extremity - Pain control - mupirocin, hydrotherapy to left ankle wound - plan for repeat I&D Monday, possible integra - needs ID consult for treatment recs and duration of nec fasc - cx: e coli, enterobacter, GPC, GPRs  Tyler Brock, Tyler Brock 09/07/2014, 9:12 AM [phone]
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
## 2014-09-08 ENCOUNTER — Inpatient Hospital Stay (HOSPITAL_COMMUNITY): Payer: BC Managed Care – PPO | Admitting: Anesthesiology

## 2014-09-08 ENCOUNTER — Inpatient Hospital Stay (HOSPITAL_COMMUNITY): Payer: BC Managed Care – PPO

## 2014-09-08 ENCOUNTER — Encounter (HOSPITAL_COMMUNITY): Payer: Self-pay | Admitting: *Deleted

## 2014-09-08 ENCOUNTER — Encounter (HOSPITAL_COMMUNITY)
Admission: EM | Disposition: A | Discharge status: Home Health | Payer: Self-pay | Source: Home / Self Care | Attending: Internal Medicine

## 2014-09-08 ENCOUNTER — Telehealth: Payer: Self-pay

## 2014-09-08 HISTORY — PX: I & D EXTREMITY: SHX5045

## 2014-09-08 LAB — CULTURE, ROUTINE-ABSCESS

## 2014-09-08 LAB — CLOSTRIDIUM DIFFICILE BY PCR: Toxigenic C. Difficile by PCR: NEGATIVE

## 2014-09-08 SURGERY — IRRIGATION AND DEBRIDEMENT EXTREMITY
Anesthesia: General | Site: Ankle | Laterality: Right

## 2014-09-08 MED ORDER — LACTATED RINGERS IV SOLN
INTRAVENOUS | Status: DC
  Administered 2014-09-08: 14:00:00 via INTRAVENOUS

## 2014-09-08 MED ORDER — HYDROMORPHONE HCL 1 MG/ML IJ SOLN: 0.2500 mg | INTRAMUSCULAR | Status: DC | PRN

## 2014-09-08 MED ORDER — MEPERIDINE HCL 25 MG/ML IJ SOLN: 6.2500 mg | INTRAMUSCULAR | Status: DC | PRN

## 2014-09-08 MED ORDER — SUCCINYLCHOLINE CHLORIDE 20 MG/ML IJ SOLN
INTRAMUSCULAR | Status: AC
  Filled 2014-09-08: qty 1

## 2014-09-08 MED ORDER — ONDANSETRON HCL 4 MG/2ML IJ SOLN
INTRAMUSCULAR | Status: AC
  Filled 2014-09-08: qty 2

## 2014-09-08 MED ORDER — GADOBENATE DIMEGLUMINE 529 MG/ML IV SOLN
20.0000 mL | Freq: Once | INTRAVENOUS | Status: AC | PRN
  Administered 2014-09-08: 20 mL via INTRAVENOUS

## 2014-09-08 MED ORDER — FENTANYL CITRATE 0.05 MG/ML IJ SOLN
INTRAMUSCULAR | Status: DC | PRN
  Administered 2014-09-08 (×3): 25 ug via INTRAVENOUS
  Administered 2014-09-08: 50 ug via INTRAVENOUS
  Administered 2014-09-08: 25 ug via INTRAVENOUS
  Administered 2014-09-08: 50 ug via INTRAVENOUS

## 2014-09-08 MED ORDER — SODIUM CHLORIDE 0.9 % IR SOLN
Status: DC | PRN
  Administered 2014-09-08 (×2): 3000 mL

## 2014-09-08 MED ORDER — MIDAZOLAM HCL 2 MG/2ML IJ SOLN
INTRAMUSCULAR | Status: AC
  Filled 2014-09-08: qty 2

## 2014-09-08 MED ORDER — LIDOCAINE HCL (CARDIAC) 20 MG/ML IV SOLN
INTRAVENOUS | Status: DC | PRN
  Administered 2014-09-08: 100 mg via INTRAVENOUS

## 2014-09-08 MED ORDER — DEXAMETHASONE SODIUM PHOSPHATE 4 MG/ML IJ SOLN
INTRAMUSCULAR | Status: DC | PRN
  Administered 2014-09-08: 4 mg via INTRAVENOUS

## 2014-09-08 MED ORDER — ROCURONIUM BROMIDE 50 MG/5ML IV SOLN
INTRAVENOUS | Status: AC
  Filled 2014-09-08: qty 1

## 2014-09-08 MED ORDER — PROPOFOL 10 MG/ML IV BOLUS
INTRAVENOUS | Status: AC
  Filled 2014-09-08: qty 20

## 2014-09-08 MED ORDER — PROPOFOL 10 MG/ML IV BOLUS
INTRAVENOUS | Status: DC | PRN
  Administered 2014-09-08: 200 mg via INTRAVENOUS

## 2014-09-08 MED ORDER — FENTANYL CITRATE 0.05 MG/ML IJ SOLN
INTRAMUSCULAR | Status: AC
  Filled 2014-09-08: qty 5

## 2014-09-08 MED ORDER — ONDANSETRON HCL 4 MG/2ML IJ SOLN
INTRAMUSCULAR | Status: DC | PRN
  Administered 2014-09-08: 4 mg via INTRAVENOUS

## 2014-09-08 MED ORDER — OXYCODONE HCL 5 MG PO TABS: 5.0000 mg | ORAL_TABLET | Freq: Once | ORAL | Status: DC | PRN

## 2014-09-08 MED ORDER — DEXAMETHASONE SODIUM PHOSPHATE 4 MG/ML IJ SOLN
INTRAMUSCULAR | Status: AC
  Filled 2014-09-08: qty 1

## 2014-09-08 MED ORDER — ONDANSETRON HCL 4 MG/2ML IJ SOLN: 4.0000 mg | Freq: Once | INTRAMUSCULAR | Status: DC | PRN

## 2014-09-08 MED ORDER — 0.9 % SODIUM CHLORIDE (POUR BTL) OPTIME
TOPICAL | Status: DC | PRN
  Administered 2014-09-08: 1000 mL

## 2014-09-08 MED ORDER — OXYCODONE HCL 5 MG/5ML PO SOLN: 5.0000 mg | Freq: Once | ORAL | Status: DC | PRN

## 2014-09-08 SURGICAL SUPPLY — 77 items
BANDAGE ELASTIC 3 VELCRO ST LF (GAUZE/BANDAGES/DRESSINGS) IMPLANT
BANDAGE ELASTIC 6 VELCRO ST LF (GAUZE/BANDAGES/DRESSINGS) IMPLANT
BLADE SURG 10 STRL SS (BLADE) IMPLANT
BLADE SURG ROTATE 9660 (MISCELLANEOUS) IMPLANT
BNDG COHESIVE 1X5 TAN STRL LF (GAUZE/BANDAGES/DRESSINGS) IMPLANT
BNDG COHESIVE 4X5 TAN STRL (GAUZE/BANDAGES/DRESSINGS) ×3 IMPLANT
BNDG COHESIVE 6X5 TAN STRL LF (GAUZE/BANDAGES/DRESSINGS) ×6 IMPLANT
BNDG CONFORM 3 STRL LF (GAUZE/BANDAGES/DRESSINGS) IMPLANT
BNDG GAUZE ELAST 4 BULKY (GAUZE/BANDAGES/DRESSINGS) IMPLANT
BNDG GAUZE STRTCH 6 (GAUZE/BANDAGES/DRESSINGS) ×9 IMPLANT
CANISTER SUCTION 2500CC (MISCELLANEOUS) IMPLANT
CANISTER WOUND CARE 500ML ATS (WOUND CARE) ×3 IMPLANT
CORDS BIPOLAR (ELECTRODE) IMPLANT
COVER SURGICAL LIGHT HANDLE (MISCELLANEOUS) ×3 IMPLANT
CUFF TOURNIQUET SINGLE 24IN (TOURNIQUET CUFF) IMPLANT
CUFF TOURNIQUET SINGLE 34IN LL (TOURNIQUET CUFF) ×6 IMPLANT
CUFF TOURNIQUET SINGLE 44IN (TOURNIQUET CUFF) IMPLANT
DERMACARRIERS GRAFT 1 TO 1.5 (DISPOSABLE)
DRAPE EXTREMITY BILATERAL (DRAPE) IMPLANT
DRAPE IMP U-DRAPE 54X76 (DRAPES) ×3 IMPLANT
DRAPE INCISE IOBAN 66X45 STRL (DRAPES) IMPLANT
DRAPE SURG 17X23 STRL (DRAPES) IMPLANT
DRAPE U-SHAPE 47X51 STRL (DRAPES) ×3 IMPLANT
DRESSING TELFA 8X3 (GAUZE/BANDAGES/DRESSINGS) IMPLANT
DRSG ADAPTIC 3X8 NADH LF (GAUZE/BANDAGES/DRESSINGS) ×3 IMPLANT
DRSG MEPILEX BORDER 4X8 (GAUZE/BANDAGES/DRESSINGS) ×3 IMPLANT
DRSG PAD ABDOMINAL 8X10 ST (GAUZE/BANDAGES/DRESSINGS) IMPLANT
DRSG VAC ATS MED SENSATRAC (GAUZE/BANDAGES/DRESSINGS) ×3 IMPLANT
DURAPREP 26ML APPLICATOR (WOUND CARE) IMPLANT
ELECT CAUTERY BLADE 6.4 (BLADE) IMPLANT
ELECT REM PT RETURN 9FT ADLT (ELECTROSURGICAL)
ELECTRODE REM PT RTRN 9FT ADLT (ELECTROSURGICAL) IMPLANT
FACESHIELD WRAPAROUND (MASK) IMPLANT
GAUZE SPONGE 4X4 12PLY STRL (GAUZE/BANDAGES/DRESSINGS) IMPLANT
GAUZE XEROFORM 1X8 LF (GAUZE/BANDAGES/DRESSINGS) IMPLANT
GAUZE XEROFORM 5X9 LF (GAUZE/BANDAGES/DRESSINGS) IMPLANT
GLOVE BIO SURGEON STRL SZ 6.5 (GLOVE) ×12 IMPLANT
GLOVE ORTHO TXT STRL SZ7.5 (GLOVE) ×3 IMPLANT
GLOVE SURG SS PI 6.5 STRL IVOR (GLOVE) ×3 IMPLANT
GLOVE SURG SYN 7.5  E (GLOVE) ×2
GLOVE SURG SYN 7.5 E (GLOVE) ×4 IMPLANT
GOWN STRL REIN XL XLG (GOWN DISPOSABLE) ×3 IMPLANT
GOWN STRL REUS W/ TWL LRG LVL3 (GOWN DISPOSABLE) ×4 IMPLANT
GOWN STRL REUS W/TWL LRG LVL3 (GOWN DISPOSABLE) ×2
GRAFT DERMACARRIERS 1 TO 1.5 (DISPOSABLE) IMPLANT
HANDPIECE INTERPULSE COAX TIP (DISPOSABLE) ×1
KIT BASIN OR (CUSTOM PROCEDURE TRAY) ×3 IMPLANT
KIT ROOM TURNOVER OR (KITS) ×3 IMPLANT
MANIFOLD NEPTUNE II (INSTRUMENTS) ×3 IMPLANT
NS IRRIG 1000ML POUR BTL (IV SOLUTION) ×3 IMPLANT
PACK ORTHO EXTREMITY (CUSTOM PROCEDURE TRAY) ×3 IMPLANT
PAD ABD 8X10 STRL (GAUZE/BANDAGES/DRESSINGS) IMPLANT
PAD ARMBOARD 7.5X6 YLW CONV (MISCELLANEOUS) ×3 IMPLANT
PADDING CAST ABS 4INX4YD NS (CAST SUPPLIES)
PADDING CAST ABS COTTON 4X4 ST (CAST SUPPLIES) IMPLANT
PADDING CAST COTTON 6X4 STRL (CAST SUPPLIES) IMPLANT
SET CYSTO W/LG BORE CLAMP LF (SET/KITS/TRAYS/PACK) ×3 IMPLANT
SET HNDPC FAN SPRY TIP SCT (DISPOSABLE) ×2 IMPLANT
SPONGE LAP 18X18 X RAY DECT (DISPOSABLE) IMPLANT
STAPLER VISISTAT 35W (STAPLE) IMPLANT
STOCKINETTE IMPERVIOUS 9X36 MD (GAUZE/BANDAGES/DRESSINGS) ×3 IMPLANT
SUT ETHILON 2 0 FS 18 (SUTURE) IMPLANT
SUT ETHILON 2 0 PSLX (SUTURE) IMPLANT
SUT ETHILON 3 0 PS 1 (SUTURE) IMPLANT
SUT MNCRL AB 4-0 PS2 18 (SUTURE) IMPLANT
SUT VIC AB 2-0 CT1 36 (SUTURE) IMPLANT
SUT VIC AB 2-0 FS1 27 (SUTURE) IMPLANT
SYR CONTROL 10ML LL (SYRINGE) IMPLANT
TOWEL OR 17X24 6PK STRL BLUE (TOWEL DISPOSABLE) ×3 IMPLANT
TOWEL OR 17X26 10 PK STRL BLUE (TOWEL DISPOSABLE) ×3 IMPLANT
TUBE ANAEROBIC SPECIMEN COL (MISCELLANEOUS) IMPLANT
TUBE CONNECTING 12X1/4 (SUCTIONS) ×3 IMPLANT
TUBE FEEDING 5FR 15 INCH (TUBING) IMPLANT
TUBING CYSTO DISP (UROLOGICAL SUPPLIES) IMPLANT
UNDERPAD 30X30 INCONTINENT (UNDERPADS AND DIAPERS) ×3 IMPLANT
WATER STERILE IRR 1000ML POUR (IV SOLUTION) IMPLANT
YANKAUER SUCT BULB TIP NO VENT (SUCTIONS) IMPLANT

## 2014-09-08 NOTE — Telephone Encounter (Signed)
Pt mom would like a callback. She states that she is upset that it took so long for her to get a response back about patient. Pt mom was sent to call a nurse so i dont  Know how long they had her on hold before we got the information. Pt mother number is [phone]
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
## 2014-09-08 NOTE — H&P (Signed)
H&P update  The surgical history has been reviewed and remains accurate without interval change.  The patient was re-examined and patient's physiologic condition has not changed significantly in the last 30 days. The condition still exists that makes this procedure necessary. The treatment plan remains the same, without new options for care.  No new pharmacological allergies or types of therapy has been initiated that would change the plan or the appropriateness of the plan.  The patient and/or family understand the potential benefits and risks.  [name]. Michael Xu, MD 09/08/2014 1:24 AM
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
## 2014-09-08 NOTE — Anesthesia Preprocedure Evaluation (Addendum)
Anesthesia Evaluation  Patient identified by MRN, date of birth, ID band Patient awake    Reviewed: Allergy & Precautions, H&P , NPO status , Patient's Chart, lab work & pertinent test results  Airway Mallampati: I TM Distance: >3 FB Neck ROM: Full    Dental  (+) Dental Advisory Given   Pulmonary asthma ,          Cardiovascular     Neuro/Psych PSYCHIATRIC DISORDERS    GI/Hepatic (+) Hepatitis -  Endo/Other    Renal/GU      Musculoskeletal   Abdominal   Peds  Hematology   Anesthesia Other Findings   Reproductive/Obstetrics                          Anesthesia Physical Anesthesia Plan  ASA: II  Anesthesia Plan: General   Post-op Pain Management:    Induction: Intravenous  Airway Management Planned: LMA  Additional Equipment:   Intra-op Plan:   Post-operative Plan: Extubation in OR  Informed Consent: I have reviewed the patients History and Physical, chart, labs and discussed the procedure including the risks, benefits and alternatives for the proposed anesthesia with the patient or authorized representative who has indicated his/her understanding and acceptance.   Dental advisory given  Plan Discussed with: Surgeon and CRNA  Anesthesia Plan Comments:        Anesthesia Quick Evaluation

## 2014-09-08 NOTE — Transfer of Care (Signed)
Immediate Anesthesia Transfer of Care Note  Patient: Tyler BerkshireDaniel Brock  Procedure(s) Performed: Procedure(s): IRRIGATION AND DEBRIDEMENT RIGHT LEG WITH APPLICATION OF WOUND VAC (Right)  Patient Location: PACU  Anesthesia Type:General  Level of Consciousness: awake and alert   Airway & Oxygen Therapy: Patient Spontanous Breathing and Patient connected to nasal cannula oxygen  Post-op Assessment: Report given to PACU RN and Post -op Vital signs reviewed and stable  Post vital signs: Reviewed and stable  Complications: No apparent anesthesia complications

## 2014-09-08 NOTE — Anesthesia Procedure Notes (Signed)
Procedure Name: LMA Insertion Date/Time: 09/08/2014 3:03 PM Performed by: Elon AlasLEE, Sherod Cisse BROWN Pre-anesthesia Checklist: Patient identified, Emergency Drugs available, Suction available, Patient being monitored and Timeout performed Patient Re-evaluated:Patient Re-evaluated prior to inductionOxygen Delivery Method: Circle system utilized Preoxygenation: Pre-oxygenation with 100% oxygen Intubation Type: IV induction Ventilation: Mask ventilation without difficulty LMA: LMA inserted LMA Size: 5.0 Number of attempts: 1 Placement Confirmation: CO2 detector,  positive ETCO2 and breath sounds checked- equal and bilateral Tube secured with: Tape Dental Injury: Teeth and Oropharynx as per pre-operative assessment
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
## 2014-09-08 NOTE — Op Note (Signed)
Date of surgery: 09/08/2049  Preoperative diagnosis: Right lower leg necrotizing fasciitis  Postoperative diagnosis: Same  Procedure: 1. Irrigation and debridement of right lower leg muscle, subcutaneous tissue 7 cm x 8 cm 2. Application of negative wound therapy greater than 50 cm.  Surgeon: Glee ArvinMichael Xu, M.D.  Anesthesia: Gen.  Estimated blood loss: Minimal  Complications: None  Indication for procedure: Patient returns back today for a repeat washout of his wound and possible application of Integra.  Description of procedure: The patient was identified in the preoperative holding area. The operative site was marked by the surgeon and confirmed the patient. Brought back to his operating room. His placed supine on the table. General anesthesia was induced. A timeout was performed preoperative antibiotics were given right lower extremity was prepped and draped in standard sterile fashion the wound was examined and it showed continued undermining the wound. The wound bed exhibited red and beefy tissue that bled well. Because of continued cellulitis in the shin the undermined portion was sharply debrided using a curette. There is no signs of necrotic tissue or continued infection. This was also done for the undermined area in the dorsum of the foot. After adequate sharp excisional debridement of the muscle and subcutaneous tissue had been performed I irrigated 6 L of normal saline through the wound. A wound VAC was then placed in the wound. Patient tolerated the procedure well was extubated and transferred to the PACU in stable condition.  Disposition: The patient will need an MRI of his right tibia and foot to rule out abscess. He will likely be ready for placement of Integra in about a week. If the MRI confirms that he has no abscess and the plan will be to discharge him home with home VAC changes every other day.  [name]. Michael Xu, MD [facility] [phone] 4:02 PM

## 2014-09-09 LAB — COMPREHENSIVE METABOLIC PANEL
ALT: 22 U/L (ref 0–53)
AST: 25 U/L (ref 0–37)
Albumin: 2.5 g/dL — ABNORMAL LOW (ref 3.5–5.2)
Alkaline Phosphatase: 52 U/L (ref 39–117)
Anion gap: 13 (ref 5–15)
BUN: 16 mg/dL (ref 6–23)
CALCIUM: 8.2 mg/dL — AB (ref 8.4–10.5)
CO2: 23 mEq/L (ref 19–32)
Chloride: 101 mEq/L (ref 96–112)
Creatinine, Ser: 0.99 mg/dL (ref 0.50–1.35)
GFR calc Af Amer: >90 mL/min (ref >90)
GLUCOSE: 95 mg/dL (ref 70–99)
Potassium: 4.3 mEq/L (ref 3.7–5.3)
Sodium: 137 mEq/L (ref 137–147)
Total Bilirubin: 0.6 mg/dL (ref 0.3–1.2)
Total Protein: 6.3 g/dL (ref 6.0–8.3)

## 2014-09-09 LAB — ANAEROBIC CULTURE

## 2014-09-09 LAB — CBC
HCT: 33.4 % — ABNORMAL LOW (ref 39.0–52.0)
HEMOGLOBIN: 11.6 g/dL — AB (ref 13.0–17.0)
MCH: 29.7 pg (ref 26.0–34.0)
MCHC: 34.7 g/dL (ref 30.0–36.0)
MCV: 85.4 fL (ref 78.0–100.0)
Platelets: 235 10*3/uL (ref 150–400)
RBC: 3.91 MIL/uL — ABNORMAL LOW (ref 4.22–5.81)
RDW: 12.6 % (ref 11.5–15.5)
WBC: 9.6 10*3/uL (ref 4.0–10.5)

## 2014-09-09 MED ORDER — DEXTROSE 5 % IV SOLN: 2.0000 g | INTRAVENOUS | Status: DC

## 2014-09-09 MED ORDER — DSS 100 MG PO CAPS: 100.0000 mg | ORAL_CAPSULE | Freq: Two times a day (BID) | ORAL | Status: DC | PRN

## 2014-09-09 MED ORDER — HEPARIN SOD (PORK) LOCK FLUSH 100 UNIT/ML IV SOLN
250.0000 [IU] | INTRAVENOUS | Status: AC | PRN
  Administered 2014-09-09: 250 [IU]

## 2014-09-09 MED ORDER — TRAZODONE HCL 100 MG PO TABS: 100.0000 mg | ORAL_TABLET | Freq: Every evening | ORAL | Status: DC | PRN

## 2014-09-09 MED ORDER — SENNA 8.6 MG PO TABS: 1.0000 | ORAL_TABLET | Freq: Every day | ORAL | Status: DC | PRN

## 2014-09-09 NOTE — Progress Notes (Signed)
Patient will be scheduled for surgery next Wednesday.  My office will contact patient about surgery.  [name]. Michael Xu, MD [facility] [phone] 2:29 PM

## 2014-09-09 NOTE — Progress Notes (Signed)
Physical Therapy Wound Treatment and Discharge  Patient Details  Name: Tyler Brock MRN: [id number] Date of Birth: [birth year]  Today's Date: 09/09/2014 Time: 5374-8270 Time Calculation (min): 19 min  Subjective  Subjective: Pt reports his Lt leg is looking better Patient and Family Stated Goals: to heal wound Date of Onset: 09/01/14 Prior Treatments: dressing changes  Pain Score:   0/10   Wound Assessment  Wound / Incision (Open or Dehisced) 09/07/14 Laceration Leg Left open laceration w/yellow discharge, ordered ointment applied. (Active)  Dressing Type Foam 09/09/2014  2:25 PM  Dressing Changed Changed 09/09/2014  2:25 PM  Dressing Status Clean;Dry;Intact 09/09/2014  2:25 PM  Dressing Change Frequency Every 5 days 09/09/2014  2:25 PM  Site / Wound Assessment Yellow;Red 09/09/2014  2:25 PM  % Wound base Red or Granulating 10% 09/09/2014  2:25 PM  % Wound base Yellow 90% 09/09/2014  2:25 PM  % Wound base Black 0% 09/09/2014  2:25 PM  % Wound base Other (Comment) 0% 09/09/2014  2:25 PM  Peri-wound Assessment Intact;Pink 09/09/2014  2:25 PM  Wound Length (cm) 4.2 cm 09/09/2014  2:25 PM  Wound Width (cm) 2.2 cm 09/09/2014  2:25 PM  Margins Unattached edges (unapproximated) 09/09/2014  2:25 PM  Closure None 09/09/2014  2:25 PM  Drainage Amount Scant 09/09/2014  2:25 PM  Drainage Description Serosanguineous 09/09/2014  2:25 PM  Non-staged Wound Description Not applicable 78/05/7543  9:20 PM  Treatment Debridement (Selective);Hydrotherapy (Pulse lavage) 09/09/2014  2:25 PM     Incision (Closed) 09/08/14 Thigh Right (Active)  Dressing Type Compression wrap 09/08/2014  9:00 PM  Dressing Clean;Dry 09/08/2014  9:00 PM  Site / Wound Assessment Dressing in place / Unable to assess 09/08/2014  9:00 PM   Hydrotherapy Pulsed lavage therapy - wound location: Lt lower leg Pulsed Lavage with Suction (psi): 4 psi (to 8) Pulsed Lavage with Suction - Normal Saline Used: 1000 mL Pulsed Lavage Tip: Tip with  splash shield Selective Debridement Selective Debridement - Location: Lt lower leg Selective Debridement - Tools Used: Forceps;Scissors Selective Debridement - Tissue Removed: yellow slough   Wound Assessment and Plan  Wound Therapy - Assess/Plan/Recommendations Wound Therapy - Clinical Statement: Pt with apparently superficial wound to left lower leg with 90% necrotic tissue. Yellow tissue very adherent to underlying tissue and minimal debridement was tolerated. Anticipate wound will do well with moist dressing and autolytic debridement.  Wound Therapy - Functional Problem List: decr skin integrity puts pt at risk for infection Factors Delaying/Impairing Wound Healing: Infection - systemic/local;Immobility Hydrotherapy Plan:  (pt to be d/c'd home today per MD) Wound Therapy - Follow Up Recommendations: Home health RN Wound Plan: d/c home today  Wound Therapy Goals- Improve the function of patient's integumentary system by progressing the wound(s) through the phases of wound healing (inflammation - proliferation - remodeling) by: Goals/treatment plan/discharge plan were made with and agreed upon by patient/family: Yes  Goals will be updated until maximal potential achieved or discharge criteria met.  Discharge criteria: when goals achieved, discharge from hospital, MD decision/surgical intervention, no progress towards goals, refusal/missing three consecutive treatments without notification or medical reason.  GP     Darlena Koval 09/09/2014, 2:37 PM Pager [phone]

## 2014-09-09 NOTE — Progress Notes (Signed)
MRI is consistent with cellulitis.  The fluid and air are post surgical and NOT abscess and/or nec fasc.  Recommend treatment of cellulitis in addition to wound infection by primary team.   Tyler ReelN. Michael Lelah Rennaker, MD [facility] [phone] 9:57 AM
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
## 2014-09-09 NOTE — Progress Notes (Signed)
   Subjective:  Patient c/o diarrhea mainly.  Objective:   VITALS:   Filed Vitals:   09/08/14 2121 09/09/14 0500 09/09/14 0554 09/09/14 0555  BP:    132/74  Pulse:    94  Temp: 98.2 F (36.8 C)   98.3 F (36.8 C)  TempSrc:      Resp:    21  Height:      Weight:  93.94 kg (207 lb 1.6 oz) 93.895 kg (207 lb)   SpO2:    100%    VAC with good seal Continued redness of shin with TTP, no fluctuance, induration   Lab Results  Component Value Date   WBC 9.6 09/09/2014   HGB 11.6* 09/09/2014   HCT 33.4* 09/09/2014   MCV 85.4 09/09/2014   PLT 235 09/09/2014     Assessment/Plan:  1 Day Post-Op   - awaiting final read of MRI - if MRI is normal, can go home with home VAC changes MWF - wound dimensions are 7 cm x 8 cm x 0.5 cm with undermining circumferentially - need SW assistance with home vac - will plan to bring back to OR in 1 week after discharge for placement of Farrel Demarkintegra  Whitnee Orzel Michael 09/09/2014, 7:49 AM [phone]

## 2014-09-09 NOTE — Discharge Instructions (Signed)
09/09/14  When Tyler Brock returns for surgeries you are to bring the pump and power cord, a clean canister and dressing. Vance PeperSusan Duha Abair, RN BSN Case Manager

## 2014-09-09 NOTE — Telephone Encounter (Signed)
Spoke with mom.  She expressed frustration in what she perceived as delay in getting Zofran sent in on 09-03-14.  Documentation shows CAN received call at 8:46Am and our office was contacted at 10:35 AM.  Pt called back at 10:47 AM upset that she had not received call back (in those 12 minutes).  Her call was answered immediately (as soon as I was notified) at 10:56 AM) and my nurse had sent rx AND notified by Mom by 11:19 AM.
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
## 2014-09-09 NOTE — Anesthesia Postprocedure Evaluation (Signed)
Anesthesia Post Note  Patient: Tyler BerkshireDaniel Brock  Procedure(s) Performed: Procedure(s) (LRB): IRRIGATION AND DEBRIDEMENT RIGHT LEG WITH APPLICATION OF WOUND VAC (Right)  Anesthesia type: general  Patient location: PACU  Post pain: Pain level controlled  Post assessment: Patient's Cardiovascular Status Stable  Last Vitals:  Filed Vitals:   09/09/14 0555  BP: 132/74  Pulse: 94  Temp: 36.8 C  Resp: 21    Post vital signs: Reviewed and stable  Level of consciousness: sedated  Complications: No apparent anesthesia complications

## 2014-09-10 ENCOUNTER — Telehealth: Payer: Self-pay | Admitting: Family Medicine

## 2014-09-10 LAB — CULTURE, BLOOD (ROUTINE X 2)
CULTURE: NO GROWTH
Culture: NO GROWTH

## 2014-09-10 MED ORDER — HYDROCODONE-ACETAMINOPHEN 5-325 MG PO TABS: 1.0000 | ORAL_TABLET | Freq: Four times a day (QID) | ORAL | Status: DC | PRN

## 2014-09-10 NOTE — Telephone Encounter (Signed)
Last refill  09/02/14 #20 0 refill Last visit 02/21/14

## 2014-09-10 NOTE — Telephone Encounter (Signed)
Refill once 

## 2014-09-10 NOTE — Telephone Encounter (Signed)
Pt mother is aware that RX is ready for pick up 
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
## 2014-09-11 ENCOUNTER — Encounter (HOSPITAL_COMMUNITY): Payer: Self-pay | Admitting: Orthopaedic Surgery

## 2014-09-11 ENCOUNTER — Telehealth: Payer: Self-pay | Admitting: *Deleted

## 2014-09-11 LAB — ANAEROBIC CULTURE

## 2014-09-11 NOTE — Telephone Encounter (Signed)
Tyler Brock is informed.

## 2014-09-11 NOTE — Telephone Encounter (Signed)
Yes.  Go ahead and add.
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
## 2014-09-11 NOTE — Telephone Encounter (Signed)
Left message for Lamar LaundrySonya to give me a call back

## 2014-09-12 ENCOUNTER — Encounter (HOSPITAL_COMMUNITY): Payer: Self-pay | Admitting: Pharmacy Technician

## 2014-09-12 ENCOUNTER — Other Ambulatory Visit (HOSPITAL_COMMUNITY): Payer: Self-pay | Admitting: Orthopaedic Surgery

## 2014-09-16 ENCOUNTER — Encounter (HOSPITAL_COMMUNITY): Payer: Self-pay | Admitting: *Deleted

## 2014-09-16 MED ORDER — CEFAZOLIN SODIUM-DEXTROSE 2-3 GM-% IV SOLR
2.0000 g | INTRAVENOUS | Status: AC
  Administered 2014-09-17: 2 g via INTRAVENOUS
  Filled 2014-09-16: qty 50

## 2014-09-16 NOTE — H&P (Signed)
PREOPERATIVE H&P  Chief Complaint: Right ankle wound  HPI: Tyler Brock is a 23 y.o. male who presents for surgical treatment of Right ankle wound.  He denies any changes in medical history.  Past Medical History  Diagnosis Date  . ADD (attention deficit disorder)   . Gilbert's disease   . Hepatitis     jaundice  . Asthma     exercise induced   . Heart murmur   . Jaundice of newborn    Past Surgical History  Procedure Laterality Date  . I&d extremity Right 09/04/2014    Procedure: IRRIGATION AND DEBRIDEMENT RIGHT LEG;  Surgeon: Cheral AlmasNaiping Michael Xu, MD;  [facility];  Service: Orthopedics;  Laterality: Right;  . I&d extremity Right 09/05/2014    Procedure: IRRIGATION AND DEBRIDEMENT EXTREMITY, Closure of right inner thigh wound;  Surgeon: Cheral AlmasNaiping Michael Xu, MD;  Location: [facility] OR;  Service: Orthopedics;  Laterality: Right;  . Application of wound vac Right 09/05/2014    Procedure: APPLICATION OF WOUND VAC;  Surgeon: Cheral AlmasNaiping Michael Xu, MD;  Location: [facility] OR;  Service: Orthopedics;  Laterality: Right;  . I&d extremity Right 09/08/2014    Procedure: IRRIGATION AND DEBRIDEMENT RIGHT LEG WITH APPLICATION OF WOUND VAC;  Surgeon: Cheral AlmasNaiping Michael Xu, MD;  Location: [facility] OR;  Service: Orthopedics;  Laterality: Right;   History   Social History  . Marital Status: Single    Spouse Name: N/A    Number of Children: N/A  . Years of Education: N/A   Social History Main Topics  . Smoking status: Never Smoker   . Smokeless tobacco: Never Used  . Alcohol Use: Yes     Comment: socially  . Drug Use: No  . Sexual Activity: None   Other Topics Concern  . 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 Alcohol abuse Father   . Cancer Father     lung  . Arthritis Other   . Cancer Other     breast, prostate  . Hyperlipidemia Other   . Hypertension  Other    Allergies  Allergen Reactions  . Codeine Sulfate Other (See Comments)    Childhood allergy  . Versed [Midazolam] Other (See Comments)    Becomes combative   Prior to Admission medications   Medication Sig Start Date End Date Taking? Authorizing Provider  cefTRIAXone 2 g in dextrose 5 % 50 mL Inject 2 g into the vein daily. 09/09/14 09/30/14 Yes Richarda OverlieNayana Abrol, MD  HYDROcodone-acetaminophen (NORCO) 5-325 MG per tablet Take 1-2 tablets by mouth every 6 (six) hours as needed (for pain). 09/10/14  Yes Kristian CoveyBruce W Burchette, MD  ondansetron (ZOFRAN) 8 MG tablet Take 1 tablet (8 mg total) by mouth every 8 (eight) hours as needed for nausea or vomiting. 09/03/14  Yes Kristian CoveyBruce W Burchette, MD  OxyCODONE HCl, Abuse Deter, 5 MG TABA Take 5 mg by mouth every 6 (six) hours as needed (PAIN).   Yes Historical Provider, MD  triamcinolone cream (KENALOG) 0.1 % Apply 1 application topically 2 (two) times daily as needed (for flare ups).   Yes Historical Provider, MD  docusate sodium 100 MG CAPS Take 100 mg by mouth 2 (two) times daily as needed for mild constipation. 09/09/14   Richarda OverlieNayana Abrol, MD  senna (SENOKOT) 8.6  MG TABS tablet Take 1 tablet (8.6 mg total) by mouth daily as needed for mild constipation. 09/09/14   Richarda OverlieNayana Abrol, MD  traZODone (DESYREL) 100 MG tablet Take 1 tablet (100 mg total) by mouth at bedtime as needed for sleep. 09/09/14   Richarda OverlieNayana Abrol, MD     Positive ROS: All other systems have been reviewed and were otherwise negative with the exception of those mentioned in the HPI and as above.  Physical Exam: General: Alert, no acute distress Cardiovascular: No pedal edema Respiratory: No cyanosis, no use of accessory musculature GI: abdomen soft Skin: No lesions in the area of chief complaint Neurologic: Sensation intact distally Psychiatric: Patient is competent for consent with normal mood and affect Lymphatic: no lymphedema  MUSCULOSKELETAL:  - VAC in place - exam  stable  Assessment: Right ankle wound  Plan: Plan for Procedure(s): IRRIGATION AND DEBRIDEMENT RIGHT ANKLE, INTEGRA PLACEMENT, WOUND VAC APPLICATION OF WOUND VAC  The risks benefits and alternatives were discussed with the patient including but not limited to the risks of nonoperative treatment, versus surgical intervention including infection, bleeding, nerve injury,  blood clots, cardiopulmonary complications, morbidity, mortality, among others, and they were willing to proceed.   Cheral AlmasXu, Naiping Michael, MD   09/16/2014 9:12 PM

## 2014-09-17 ENCOUNTER — Encounter (HOSPITAL_COMMUNITY): Payer: Self-pay

## 2014-09-17 ENCOUNTER — Encounter (HOSPITAL_COMMUNITY): Payer: BC Managed Care – PPO | Admitting: Certified Registered"

## 2014-09-17 ENCOUNTER — Ambulatory Visit (HOSPITAL_COMMUNITY)
Admission: RE | Admit: 2014-09-17 | Discharge: 2014-09-17 | Disposition: A | Discharge status: Home / Self Care | Payer: BC Managed Care – PPO | Source: Ambulatory Visit | Attending: Orthopaedic Surgery | Admitting: Orthopaedic Surgery

## 2014-09-17 ENCOUNTER — Encounter (HOSPITAL_COMMUNITY)
Admission: RE | Disposition: A | Discharge status: Home / Self Care | Payer: Self-pay | Source: Ambulatory Visit | Attending: Orthopaedic Surgery

## 2014-09-17 ENCOUNTER — Ambulatory Visit (HOSPITAL_COMMUNITY): Payer: BC Managed Care – PPO | Admitting: Certified Registered"

## 2014-09-17 DIAGNOSIS — Z888 Allergy status to other drugs, medicaments and biological substances status: Secondary | ICD-10-CM | POA: Insufficient documentation

## 2014-09-17 DIAGNOSIS — Z79899 Other long term (current) drug therapy: Secondary | ICD-10-CM | POA: Insufficient documentation

## 2014-09-17 DIAGNOSIS — M726 Necrotizing fasciitis: Secondary | ICD-10-CM | POA: Insufficient documentation

## 2014-09-17 DIAGNOSIS — Z885 Allergy status to narcotic agent status: Secondary | ICD-10-CM | POA: Diagnosis not present

## 2014-09-17 DIAGNOSIS — K759 Inflammatory liver disease, unspecified: Secondary | ICD-10-CM | POA: Insufficient documentation

## 2014-09-17 DIAGNOSIS — J45990 Exercise induced bronchospasm: Secondary | ICD-10-CM | POA: Diagnosis not present

## 2014-09-17 HISTORY — PX: APPLICATION OF WOUND VAC: SHX5189

## 2014-09-17 HISTORY — PX: I & D EXTREMITY: SHX5045

## 2014-09-17 SURGERY — IRRIGATION AND DEBRIDEMENT EXTREMITY
Anesthesia: General | Site: Ankle | Laterality: Right

## 2014-09-17 MED ORDER — OXYCODONE HCL 5 MG PO TABS
5.0000 mg | ORAL_TABLET | Freq: Once | ORAL | Status: AC | PRN
  Administered 2014-09-17: 5 mg via ORAL

## 2014-09-17 MED ORDER — DEXAMETHASONE SODIUM PHOSPHATE 4 MG/ML IJ SOLN
INTRAMUSCULAR | Status: AC
  Filled 2014-09-17: qty 1

## 2014-09-17 MED ORDER — HYDROCODONE-ACETAMINOPHEN 5-325 MG PO TABS: 1.0000 | ORAL_TABLET | Freq: Four times a day (QID) | ORAL | Status: DC | PRN

## 2014-09-17 MED ORDER — LACTATED RINGERS IV SOLN
INTRAVENOUS | Status: DC | PRN
  Administered 2014-09-17: 08:00:00 via INTRAVENOUS

## 2014-09-17 MED ORDER — OXYCODONE HCL 5 MG/5ML PO SOLN: 5.0000 mg | Freq: Once | ORAL | Status: AC | PRN

## 2014-09-17 MED ORDER — PROPOFOL 10 MG/ML IV BOLUS
INTRAVENOUS | Status: DC | PRN
  Administered 2014-09-17: 200 mg via INTRAVENOUS

## 2014-09-17 MED ORDER — BACITRACIN-NEOMYCIN-POLYMYXIN 400-5-5000 EX OINT
TOPICAL_OINTMENT | CUTANEOUS | Status: AC
  Filled 2014-09-17: qty 1

## 2014-09-17 MED ORDER — SODIUM CHLORIDE 0.9 % IR SOLN
Status: DC | PRN
  Administered 2014-09-17: 3000 mL

## 2014-09-17 MED ORDER — SODIUM CHLORIDE 0.9 % IJ SOLN
INTRAMUSCULAR | Status: AC
  Filled 2014-09-17: qty 10

## 2014-09-17 MED ORDER — SUFENTANIL CITRATE 50 MCG/ML IV SOLN
INTRAVENOUS | Status: AC
  Filled 2014-09-17: qty 1

## 2014-09-17 MED ORDER — PROMETHAZINE HCL 25 MG/ML IJ SOLN: 6.2500 mg | INTRAMUSCULAR | Status: DC | PRN

## 2014-09-17 MED ORDER — ONDANSETRON HCL 4 MG/2ML IJ SOLN
INTRAMUSCULAR | Status: AC
  Filled 2014-09-17: qty 2

## 2014-09-17 MED ORDER — HYDROMORPHONE HCL 1 MG/ML IJ SOLN
INTRAMUSCULAR | Status: AC
  Filled 2014-09-17: qty 1

## 2014-09-17 MED ORDER — OXYCODONE HCL 5 MG PO TABS: 5.0000 mg | ORAL_TABLET | ORAL | Status: DC | PRN

## 2014-09-17 MED ORDER — HYDROMORPHONE HCL 1 MG/ML IJ SOLN
0.2500 mg | INTRAMUSCULAR | Status: DC | PRN
  Administered 2014-09-17: 0.5 mg via INTRAVENOUS

## 2014-09-17 MED ORDER — LIDOCAINE HCL (CARDIAC) 20 MG/ML IV SOLN
INTRAVENOUS | Status: DC | PRN
  Administered 2014-09-17: 100 mg via INTRAVENOUS

## 2014-09-17 MED ORDER — PROPOFOL 10 MG/ML IV BOLUS
INTRAVENOUS | Status: AC
  Filled 2014-09-17: qty 20

## 2014-09-17 MED ORDER — SUFENTANIL CITRATE 50 MCG/ML IV SOLN
INTRAVENOUS | Status: DC | PRN
  Administered 2014-09-17: 10 ug via INTRAVENOUS

## 2014-09-17 MED ORDER — SODIUM CHLORIDE 0.9 % IR SOLN
Status: DC | PRN
  Administered 2014-09-17: 1000 mL

## 2014-09-17 MED ORDER — LIDOCAINE HCL (CARDIAC) 20 MG/ML IV SOLN
INTRAVENOUS | Status: AC
  Filled 2014-09-17: qty 5

## 2014-09-17 MED ORDER — OXYCODONE HCL 5 MG PO TABS
ORAL_TABLET | ORAL | Status: AC
  Filled 2014-09-17: qty 1

## 2014-09-17 MED ORDER — MIDAZOLAM HCL 2 MG/2ML IJ SOLN
INTRAMUSCULAR | Status: AC
  Filled 2014-09-17: qty 2

## 2014-09-17 MED ORDER — DEXAMETHASONE SODIUM PHOSPHATE 4 MG/ML IJ SOLN
INTRAMUSCULAR | Status: DC | PRN
  Administered 2014-09-17: 4 mg via INTRAVENOUS

## 2014-09-17 SURGICAL SUPPLY — 65 items
BANDAGE ELASTIC 3 VELCRO ST LF (GAUZE/BANDAGES/DRESSINGS) IMPLANT
BLADE SURG 10 STRL SS (BLADE) ×2 IMPLANT
BNDG COHESIVE 1X5 TAN STRL LF (GAUZE/BANDAGES/DRESSINGS) IMPLANT
BNDG COHESIVE 4X5 TAN STRL (GAUZE/BANDAGES/DRESSINGS) IMPLANT
BNDG COHESIVE 6X5 TAN STRL LF (GAUZE/BANDAGES/DRESSINGS) IMPLANT
BNDG CONFORM 3 STRL LF (GAUZE/BANDAGES/DRESSINGS) IMPLANT
BNDG GAUZE STRTCH 6 (GAUZE/BANDAGES/DRESSINGS) ×6 IMPLANT
CORDS BIPOLAR (ELECTRODE) IMPLANT
COVER SURGICAL LIGHT HANDLE (MISCELLANEOUS) ×2 IMPLANT
CUFF TOURNIQUET SINGLE 24IN (TOURNIQUET CUFF) IMPLANT
CUFF TOURNIQUET SINGLE 34IN LL (TOURNIQUET CUFF) ×4 IMPLANT
CUFF TOURNIQUET SINGLE 44IN (TOURNIQUET CUFF) IMPLANT
DRAPE EXTREMITY BILATERAL (DRAPE) IMPLANT
DRAPE IMP U-DRAPE 54X76 (DRAPES) IMPLANT
DRAPE INCISE IOBAN 66X45 STRL (DRAPES) IMPLANT
DRAPE SURG 17X23 STRL (DRAPES) IMPLANT
DRAPE U-SHAPE 47X51 STRL (DRAPES) ×2 IMPLANT
DRSG ADAPTIC 3X8 NADH LF (GAUZE/BANDAGES/DRESSINGS) ×2 IMPLANT
DRSG MEPILEX BORDER 4X4 (GAUZE/BANDAGES/DRESSINGS) ×2 IMPLANT
DRSG VAC ATS MED SENSATRAC (GAUZE/BANDAGES/DRESSINGS) ×2 IMPLANT
DURAPREP 26ML APPLICATOR (WOUND CARE) IMPLANT
ELECT CAUTERY BLADE 6.4 (BLADE) ×2 IMPLANT
ELECT REM PT RETURN 9FT ADLT (ELECTROSURGICAL)
ELECTRODE REM PT RTRN 9FT ADLT (ELECTROSURGICAL) IMPLANT
FACESHIELD WRAPAROUND (MASK) ×2 IMPLANT
GAUZE SPONGE 4X4 12PLY STRL (GAUZE/BANDAGES/DRESSINGS) IMPLANT
GAUZE XEROFORM 1X8 LF (GAUZE/BANDAGES/DRESSINGS) IMPLANT
GAUZE XEROFORM 5X9 LF (GAUZE/BANDAGES/DRESSINGS) IMPLANT
GLOVE SURG SYN 7.5  E (GLOVE) ×2
GLOVE SURG SYN 7.5 E (GLOVE) ×2 IMPLANT
GOWN STRL REIN XL XLG (GOWN DISPOSABLE) ×2 IMPLANT
GOWN STRL REUS W/ TWL LRG LVL3 (GOWN DISPOSABLE) ×1 IMPLANT
GOWN STRL REUS W/TWL LRG LVL3 (GOWN DISPOSABLE) ×1
HANDPIECE INTERPULSE COAX TIP (DISPOSABLE)
KIT BASIN OR (CUSTOM PROCEDURE TRAY) ×2 IMPLANT
KIT ROOM TURNOVER OR (KITS) ×2 IMPLANT
MANIFOLD NEPTUNE II (INSTRUMENTS) IMPLANT
MATRIX TISSUE MESHED 4X5 (Tissue) ×2 IMPLANT
MATRIX WOUND FLOWABLE 3CC (Tissue) ×2 IMPLANT
NS IRRIG 1000ML POUR BTL (IV SOLUTION) ×2 IMPLANT
PACK ORTHO EXTREMITY (CUSTOM PROCEDURE TRAY) ×2 IMPLANT
PAD ABD 8X10 STRL (GAUZE/BANDAGES/DRESSINGS) IMPLANT
PAD ARMBOARD 7.5X6 YLW CONV (MISCELLANEOUS) ×2 IMPLANT
PADDING CAST ABS 4INX4YD NS (CAST SUPPLIES)
PADDING CAST ABS COTTON 4X4 ST (CAST SUPPLIES) IMPLANT
PADDING CAST COTTON 6X4 STRL (CAST SUPPLIES) ×2 IMPLANT
SET HNDPC FAN SPRY TIP SCT (DISPOSABLE) IMPLANT
SPONGE LAP 18X18 X RAY DECT (DISPOSABLE) ×4 IMPLANT
STAPLER VISISTAT (STAPLE) ×2 IMPLANT
STOCKINETTE IMPERVIOUS 9X36 MD (GAUZE/BANDAGES/DRESSINGS) ×2 IMPLANT
SUT ETHILON 2 0 FS 18 (SUTURE) ×2 IMPLANT
SUT ETHILON 2 0 PSLX (SUTURE) IMPLANT
SUT ETHILON 3 0 PS 1 (SUTURE) IMPLANT
SUT VIC AB 2-0 CT1 36 (SUTURE) IMPLANT
SUT VIC AB 2-0 FS1 27 (SUTURE) ×4 IMPLANT
SYR CONTROL 10ML LL (SYRINGE) IMPLANT
TOWEL OR 17X24 6PK STRL BLUE (TOWEL DISPOSABLE) ×2 IMPLANT
TOWEL OR 17X26 10 PK STRL BLUE (TOWEL DISPOSABLE) ×2 IMPLANT
TUBE ANAEROBIC SPECIMEN COL (MISCELLANEOUS) IMPLANT
TUBE CONNECTING 12X1/4 (SUCTIONS) ×2 IMPLANT
TUBE FEEDING 5FR 15 INCH (TUBING) IMPLANT
TUBING CYSTO DISP (UROLOGICAL SUPPLIES) ×2 IMPLANT
UNDERPAD 30X30 INCONTINENT (UNDERPADS AND DIAPERS) ×2 IMPLANT
WATER STERILE IRR 1000ML POUR (IV SOLUTION) IMPLANT
YANKAUER SUCT BULB TIP NO VENT (SUCTIONS) ×2 IMPLANT

## 2014-09-17 NOTE — Op Note (Signed)
Date of surgery: 09/17/2014  Preoperative diagnosis: Right ankle wound status post debridement for necrotizing fasciitis  Postoperative diagnosis: Same  Procedure: 1. Surgical preparation of recipient site of the right ankle measuring 7x8 cm 2. Application of skin substitute graft of right ankle measuring 7x8 cm 3. Application of negative wound therapy greater than 50 cm  Surgeon: Glee ArvinMichael Xu, M.D.  Anesthesia: Gen.  Estimated blood loss minimal  Complications: None  Indication for procedure: The patient returns today for a planned application of Integra to the right ankle wound.  Description of procedure: The patient was identified in the preoperative holding area. Operative site was marked by the surgeon and confirmed with the patient. He is brought back to the operating room. His placed supine on the table. General anesthesia was induced. Timeout was performed. Preoperative antibiotics were given. Right lower extremity was prepped and draped in standard sterile fashion. We first began with surgical preparation of the recipient site. Sharp debridement of the wound bed was performed by using a knife. The wound exhibited good red beefy healthy viable tissue all around. There was no undermining. Once adequate debridement had been performed I irrigated the wound out with 3 L of normal saline using cystoscopy tubing. I then placed a pre-meshed Integra onto the ankle wound. This was stapled down circumferentially. A wound VAC was placed over the wound and over the incision. The patient tolerated the procedure well and was extubated and transferred to the PACU in stable condition.  Disposition: The patient will return in one week for a staged skin graft application.  [name]. Michael Xu, MD [facility] [phone] 9:34 AM

## 2014-09-17 NOTE — Anesthesia Postprocedure Evaluation (Signed)
  Anesthesia Post-op Note  Patient: Tyler Brock  Procedure(s) Performed: Procedure(s): IRRIGATION AND DEBRIDEMENT RIGHT ANKLE, INTEGRA PLACEMENT, WOUND VAC (Right) APPLICATION OF WOUND VAC (Right)  Patient Location: PACU  Anesthesia Type:General  Level of Consciousness: awake, alert  and oriented  Airway and Oxygen Therapy: Patient Spontanous Breathing  Post-op Pain: none  Post-op Assessment: Post-op Vital signs reviewed  Post-op Vital Signs: Reviewed  Last Vitals:  Filed Vitals:   09/17/14 1004  BP: 128/60  Pulse: 78  Temp:   Resp:     Complications: No apparent anesthesia complications

## 2014-09-17 NOTE — Anesthesia Procedure Notes (Signed)
Procedure Name: LMA Insertion Date/Time: 09/17/2014 8:28 AM Performed by: Melina SchoolsBANKS, Dorsey Charette J Pre-anesthesia Checklist: Patient identified, Emergency Drugs available, Suction available, Patient being monitored and Timeout performed Patient Re-evaluated:Patient Re-evaluated prior to inductionOxygen Delivery Method: Circle system utilized Preoxygenation: Pre-oxygenation with 100% oxygen Intubation Type: IV induction Ventilation: Mask ventilation without difficulty LMA Size: 4.0 Number of attempts: 1 Placement Confirmation: positive ETCO2 and breath sounds checked- equal and bilateral Tube secured with: Tape Dental Injury: Teeth and Oropharynx as per pre-operative assessment

## 2014-09-17 NOTE — Discharge Instructions (Signed)
1. Do not change VAC 2. Elevate extremity at all times 3. Take aspirin 325 mg twice a day to prevent blood clots  Change left ankle dressing as before with the antibiotic ointment with a dry dressing.

## 2014-09-17 NOTE — Anesthesia Preprocedure Evaluation (Signed)
Anesthesia Evaluation  Patient identified by MRN, date of birth, ID band Patient awake    Reviewed: Allergy & Precautions, H&P , NPO status , Patient's Chart, lab work & pertinent test results  Airway Mallampati: I TM Distance: >3 FB Neck ROM: Full    Dental  (+) Dental Advisory Given   Pulmonary asthma ,          Cardiovascular negative cardio ROS      Neuro/Psych PSYCHIATRIC DISORDERS negative neurological ROS     GI/Hepatic negative GI ROS,   Endo/Other  negative endocrine ROS  Renal/GU negative Renal ROS     Musculoskeletal negative musculoskeletal ROS (+)   Abdominal   Peds  Hematology negative hematology ROS (+)   Anesthesia Other Findings   Reproductive/Obstetrics                           Anesthesia Physical  Anesthesia Plan  ASA: II  Anesthesia Plan: General   Post-op Pain Management:    Induction: Intravenous  Airway Management Planned: LMA  Additional Equipment:   Intra-op Plan:   Post-operative Plan: Extubation in OR  Informed Consent: I have reviewed the patients History and Physical, chart, labs and discussed the procedure including the risks, benefits and alternatives for the proposed anesthesia with the patient or authorized representative who has indicated his/her understanding and acceptance.   Dental advisory given  Plan Discussed with: Surgeon and CRNA  Anesthesia Plan Comments:         Anesthesia Quick Evaluation

## 2014-09-17 NOTE — Transfer of Care (Signed)
Immediate Anesthesia Transfer of Care Note  Patient: Tyler BerkshireDaniel Wieber  Procedure(s) Performed: Procedure(s): IRRIGATION AND DEBRIDEMENT RIGHT ANKLE, INTEGRA PLACEMENT, WOUND VAC (Right) APPLICATION OF WOUND VAC (Right)  Patient Location: PACU  Anesthesia Type:General  Level of Consciousness: awake, oriented, patient cooperative and responds to stimulation  Airway & Oxygen Therapy: Patient Spontanous Breathing and Patient connected to nasal cannula oxygen  Post-op Assessment: Report given to PACU RN, Post -op Vital signs reviewed and stable and Patient moving all extremities X 4  Post vital signs: Reviewed and stable  Complications: No apparent anesthesia complications

## 2014-09-18 ENCOUNTER — Other Ambulatory Visit (HOSPITAL_COMMUNITY): Payer: Self-pay | Admitting: Orthopaedic Surgery

## 2014-09-22 ENCOUNTER — Other Ambulatory Visit (HOSPITAL_COMMUNITY): Payer: Self-pay | Admitting: Orthopaedic Surgery

## 2014-09-23 ENCOUNTER — Encounter (HOSPITAL_COMMUNITY): Payer: Self-pay | Admitting: *Deleted

## 2014-09-23 MED ORDER — CEFAZOLIN SODIUM-DEXTROSE 2-3 GM-% IV SOLR
2.0000 g | INTRAVENOUS | Status: AC
  Administered 2014-09-24: 2 g via INTRAVENOUS
  Filled 2014-09-23: qty 50

## 2014-09-23 NOTE — Progress Notes (Signed)
Pt states that he had blood drawn yesterday by [facility] and was hoping that he would not have to have blood drawn again tomorrow. I called [facility] stated that he had a CBC w/diff, BUN and Creatinine drawn yesterday. She states she will fax this to us.

## 2014-09-24 ENCOUNTER — Encounter (HOSPITAL_COMMUNITY)
Admission: RE | Disposition: A | Discharge status: Home / Self Care | Payer: Self-pay | Source: Ambulatory Visit | Attending: Orthopaedic Surgery

## 2014-09-24 ENCOUNTER — Inpatient Hospital Stay (HOSPITAL_COMMUNITY)
Admission: RE | Admit: 2014-09-24 | Discharge: 2014-09-25 | DRG: 903 | Disposition: A | Discharge status: Home / Self Care | Payer: BC Managed Care – PPO | Source: Ambulatory Visit | Attending: Orthopaedic Surgery | Admitting: Orthopaedic Surgery

## 2014-09-24 ENCOUNTER — Encounter (HOSPITAL_COMMUNITY): Payer: Self-pay | Admitting: *Deleted

## 2014-09-24 ENCOUNTER — Ambulatory Visit (HOSPITAL_COMMUNITY): Payer: BC Managed Care – PPO | Admitting: Anesthesiology

## 2014-09-24 ENCOUNTER — Encounter (HOSPITAL_COMMUNITY): Payer: BC Managed Care – PPO | Admitting: Anesthesiology

## 2014-09-24 DIAGNOSIS — S91001A Unspecified open wound, right ankle, initial encounter: Secondary | ICD-10-CM | POA: Diagnosis present

## 2014-09-24 DIAGNOSIS — M726 Necrotizing fasciitis: Secondary | ICD-10-CM

## 2014-09-24 DIAGNOSIS — S91009A Unspecified open wound, unspecified ankle, initial encounter: Secondary | ICD-10-CM | POA: Diagnosis present

## 2014-09-24 DIAGNOSIS — S91001S Unspecified open wound, right ankle, sequela: Secondary | ICD-10-CM | POA: Diagnosis not present

## 2014-09-24 DIAGNOSIS — L03115 Cellulitis of right lower limb: Secondary | ICD-10-CM

## 2014-09-24 DIAGNOSIS — Y848 Other medical procedures as the cause of abnormal reaction of the patient, or of later complication, without mention of misadventure at the time of the procedure: Secondary | ICD-10-CM | POA: Diagnosis present

## 2014-09-24 DIAGNOSIS — T8133XA Disruption of traumatic injury wound repair, initial encounter: Principal | ICD-10-CM | POA: Diagnosis present

## 2014-09-24 DIAGNOSIS — T8133XS Disruption of traumatic injury wound repair, sequela: Secondary | ICD-10-CM | POA: Diagnosis present

## 2014-09-24 DIAGNOSIS — Z79899 Other long term (current) drug therapy: Secondary | ICD-10-CM | POA: Diagnosis not present

## 2014-09-24 HISTORY — PX: APPLICATION OF WOUND VAC: SHX5189

## 2014-09-24 HISTORY — PX: INCISION AND DRAINAGE OF WOUND: SHX1803

## 2014-09-24 HISTORY — DX: Sepsis, unspecified organism: A41.9

## 2014-09-24 HISTORY — PX: I & D EXTREMITY: SHX5045

## 2014-09-24 LAB — COMPREHENSIVE METABOLIC PANEL
ALT: 23 U/L (ref 0–53)
AST: 20 U/L (ref 0–37)
Albumin: 3.1 g/dL — ABNORMAL LOW (ref 3.5–5.2)
Alkaline Phosphatase: 68 U/L (ref 39–117)
Anion gap: 10 (ref 5–15)
BUN: 13 mg/dL (ref 6–23)
CALCIUM: 8.8 mg/dL (ref 8.4–10.5)
CO2: 28 mEq/L (ref 19–32)
Chloride: 103 mEq/L (ref 96–112)
Creatinine, Ser: 1.26 mg/dL (ref 0.50–1.35)
GFR calc Af Amer: >90 mL/min (ref >90)
GFR calc non Af Amer: 78 mL/min — ABNORMAL LOW (ref >90)
Glucose, Bld: 105 mg/dL — ABNORMAL HIGH (ref 70–99)
Potassium: 4.2 mEq/L (ref 3.7–5.3)
Sodium: 141 mEq/L (ref 137–147)
TOTAL PROTEIN: 6.2 g/dL (ref 6.0–8.3)
Total Bilirubin: 0.6 mg/dL (ref 0.3–1.2)

## 2014-09-24 LAB — CBC WITH DIFFERENTIAL/PLATELET
BASOS ABS: 0.1 10*3/uL (ref 0.0–0.1)
Basophils Relative: 1 % (ref 0–1)
EOS ABS: 0.3 10*3/uL (ref 0.0–0.7)
EOS PCT: 5 % (ref 0–5)
HCT: 32.7 % — ABNORMAL LOW (ref 39.0–52.0)
Hemoglobin: 11.1 g/dL — ABNORMAL LOW (ref 13.0–17.0)
LYMPHS PCT: 38 % (ref 12–46)
Lymphs Abs: 2.1 10*3/uL (ref 0.7–4.0)
MCH: 29.3 pg (ref 26.0–34.0)
MCHC: 33.9 g/dL (ref 30.0–36.0)
MCV: 86.3 fL (ref 78.0–100.0)
Monocytes Absolute: 0.3 10*3/uL (ref 0.1–1.0)
Monocytes Relative: 6 % (ref 3–12)
Neutro Abs: 2.7 10*3/uL (ref 1.7–7.7)
Neutrophils Relative %: 50 % (ref 43–77)
PLATELETS: 299 10*3/uL (ref 150–400)
RBC: 3.79 MIL/uL — ABNORMAL LOW (ref 4.22–5.81)
RDW: 12.4 % (ref 11.5–15.5)
WBC: 5.5 10*3/uL (ref 4.0–10.5)

## 2014-09-24 LAB — SEDIMENTATION RATE: Sed Rate: 12 mm/hr (ref 0–16)

## 2014-09-24 SURGERY — IRRIGATION AND DEBRIDEMENT EXTREMITY
Anesthesia: General | Site: Thigh | Laterality: Right

## 2014-09-24 MED ORDER — FENTANYL CITRATE 0.05 MG/ML IJ SOLN
INTRAMUSCULAR | Status: AC
  Administered 2014-09-24: 50 ug via INTRAVENOUS
  Filled 2014-09-24: qty 2

## 2014-09-24 MED ORDER — METHOCARBAMOL 1000 MG/10ML IJ SOLN
500.0000 mg | Freq: Four times a day (QID) | INTRAVENOUS | Status: DC | PRN
  Filled 2014-09-24: qty 5

## 2014-09-24 MED ORDER — FENTANYL CITRATE 0.05 MG/ML IJ SOLN
25.0000 ug | INTRAMUSCULAR | Status: DC | PRN
  Administered 2014-09-24 (×3): 50 ug via INTRAVENOUS

## 2014-09-24 MED ORDER — LACTATED RINGERS IV SOLN
INTRAVENOUS | Status: DC | PRN
  Administered 2014-09-24: 10:00:00 via INTRAVENOUS

## 2014-09-24 MED ORDER — ARTIFICIAL TEARS OP OINT
TOPICAL_OINTMENT | OPHTHALMIC | Status: AC
  Filled 2014-09-24: qty 3.5

## 2014-09-24 MED ORDER — MINERAL OIL LIGHT 100 % EX OIL
TOPICAL_OIL | CUTANEOUS | Status: AC
  Filled 2014-09-24: qty 25

## 2014-09-24 MED ORDER — SODIUM CHLORIDE 0.9 % IV SOLN
INTRAVENOUS | Status: DC
  Administered 2014-09-25: 01:00:00 via INTRAVENOUS

## 2014-09-24 MED ORDER — PROPOFOL 10 MG/ML IV BOLUS
INTRAVENOUS | Status: AC
  Filled 2014-09-24: qty 20

## 2014-09-24 MED ORDER — HYDROCODONE-ACETAMINOPHEN 5-325 MG PO TABS
1.0000 | ORAL_TABLET | Freq: Four times a day (QID) | ORAL | Status: DC | PRN
  Administered 2014-09-24 – 2014-09-25 (×4): 2 via ORAL
  Filled 2014-09-24 (×3): qty 2

## 2014-09-24 MED ORDER — FENTANYL CITRATE 0.05 MG/ML IJ SOLN
INTRAMUSCULAR | Status: AC
  Filled 2014-09-24: qty 5

## 2014-09-24 MED ORDER — KETOROLAC TROMETHAMINE 30 MG/ML IJ SOLN
INTRAMUSCULAR | Status: AC
  Filled 2014-09-24: qty 1

## 2014-09-24 MED ORDER — MINERAL OIL LIGHT 100 % EX OIL
TOPICAL_OIL | CUTANEOUS | Status: DC | PRN
  Administered 2014-09-24: 1 via TOPICAL

## 2014-09-24 MED ORDER — ONDANSETRON HCL 4 MG/2ML IJ SOLN: 4.0000 mg | Freq: Four times a day (QID) | INTRAMUSCULAR | Status: DC | PRN

## 2014-09-24 MED ORDER — METHOCARBAMOL 500 MG PO TABS: 500.0000 mg | ORAL_TABLET | Freq: Four times a day (QID) | ORAL | Status: DC | PRN

## 2014-09-24 MED ORDER — LIDOCAINE HCL (CARDIAC) 20 MG/ML IV SOLN
INTRAVENOUS | Status: AC
  Filled 2014-09-24: qty 5

## 2014-09-24 MED ORDER — DOCUSATE SODIUM 100 MG PO CAPS: 100.0000 mg | ORAL_CAPSULE | Freq: Two times a day (BID) | ORAL | Status: DC | PRN

## 2014-09-24 MED ORDER — 0.9 % SODIUM CHLORIDE (POUR BTL) OPTIME
TOPICAL | Status: DC | PRN
  Administered 2014-09-24: 1000 mL

## 2014-09-24 MED ORDER — PROPOFOL 10 MG/ML IV BOLUS
INTRAVENOUS | Status: DC | PRN
  Administered 2014-09-24: 150 mg via INTRAVENOUS

## 2014-09-24 MED ORDER — POLYETHYLENE GLYCOL 3350 17 G PO PACK: 17.0000 g | PACK | Freq: Every day | ORAL | Status: DC | PRN

## 2014-09-24 MED ORDER — EPINEPHRINE HCL 1 MG/ML IJ SOLN
INTRAMUSCULAR | Status: AC
  Filled 2014-09-24: qty 1

## 2014-09-24 MED ORDER — ONDANSETRON HCL 8 MG PO TABS
8.0000 mg | ORAL_TABLET | Freq: Three times a day (TID) | ORAL | Status: DC | PRN
  Filled 2014-09-24: qty 1

## 2014-09-24 MED ORDER — SODIUM CHLORIDE 0.9 % IR SOLN
Status: DC | PRN
  Administered 2014-09-24 (×2): 3000 mL

## 2014-09-24 MED ORDER — ARTIFICIAL TEARS OP OINT
TOPICAL_OINTMENT | OPHTHALMIC | Status: DC | PRN
  Administered 2014-09-24: 1 via OPHTHALMIC

## 2014-09-24 MED ORDER — DEXTROSE 5 % IV SOLN: 2.0000 g | INTRAVENOUS | Status: DC

## 2014-09-24 MED ORDER — LIDOCAINE HCL (CARDIAC) 20 MG/ML IV SOLN
INTRAVENOUS | Status: DC | PRN
  Administered 2014-09-24: 50 mg via INTRAVENOUS

## 2014-09-24 MED ORDER — ROCURONIUM BROMIDE 50 MG/5ML IV SOLN
INTRAVENOUS | Status: AC
  Filled 2014-09-24: qty 1

## 2014-09-24 MED ORDER — DEXTROSE 5 % IV SOLN
1.0000 g | Freq: Three times a day (TID) | INTRAVENOUS | Status: DC
  Administered 2014-09-24 – 2014-09-25 (×3): 1 g via INTRAVENOUS
  Filled 2014-09-24 (×5): qty 1

## 2014-09-24 MED ORDER — METOCLOPRAMIDE HCL 5 MG/ML IJ SOLN
5.0000 mg | Freq: Three times a day (TID) | INTRAMUSCULAR | Status: DC | PRN
Start: 2014-09-24 — End: 2014-09-25

## 2014-09-24 MED ORDER — METOCLOPRAMIDE HCL 5 MG PO TABS
5.0000 mg | ORAL_TABLET | Freq: Three times a day (TID) | ORAL | Status: DC | PRN
  Filled 2014-09-24: qty 2

## 2014-09-24 MED ORDER — ONDANSETRON HCL 4 MG/2ML IJ SOLN
INTRAMUSCULAR | Status: AC
  Filled 2014-09-24: qty 2

## 2014-09-24 MED ORDER — DEXTROSE 5 % IV SOLN
2.0000 g | INTRAVENOUS | Status: DC
  Filled 2014-09-24: qty 2

## 2014-09-24 MED ORDER — MAGNESIUM CITRATE PO SOLN: 1.0000 | Freq: Once | ORAL | Status: AC | PRN

## 2014-09-24 MED ORDER — OXYCODONE HCL 5 MG PO TABS
5.0000 mg | ORAL_TABLET | ORAL | Status: DC | PRN
  Administered 2014-09-24: 15 mg via ORAL

## 2014-09-24 MED ORDER — SENNA 8.6 MG PO TABS
1.0000 | ORAL_TABLET | Freq: Every day | ORAL | Status: DC | PRN
  Filled 2014-09-24: qty 1

## 2014-09-24 MED ORDER — SENNA 8.6 MG PO TABS
1.0000 | ORAL_TABLET | Freq: Two times a day (BID) | ORAL | Status: DC
  Administered 2014-09-24 – 2014-09-25 (×2): 8.6 mg via ORAL
  Filled 2014-09-24 (×3): qty 1

## 2014-09-24 MED ORDER — LACTATED RINGERS IV SOLN
INTRAVENOUS | Status: DC
  Administered 2014-09-24: 09:00:00 via INTRAVENOUS

## 2014-09-24 MED ORDER — OXYCODONE HCL 5 MG PO TABS
ORAL_TABLET | ORAL | Status: AC
  Filled 2014-09-24: qty 3

## 2014-09-24 MED ORDER — DIPHENHYDRAMINE HCL 12.5 MG/5ML PO ELIX: 25.0000 mg | ORAL_SOLUTION | ORAL | Status: DC | PRN

## 2014-09-24 MED ORDER — KETOROLAC TROMETHAMINE 30 MG/ML IJ SOLN
30.0000 mg | Freq: Four times a day (QID) | INTRAMUSCULAR | Status: DC
  Administered 2014-09-24 – 2014-09-25 (×4): 30 mg via INTRAVENOUS
  Filled 2014-09-24 (×9): qty 1

## 2014-09-24 MED ORDER — SORBITOL 70 % SOLN: 30.0000 mL | Freq: Every day | Status: DC | PRN

## 2014-09-24 MED ORDER — FENTANYL CITRATE 0.05 MG/ML IJ SOLN
INTRAMUSCULAR | Status: DC | PRN
  Administered 2014-09-24: 25 ug via INTRAVENOUS
  Administered 2014-09-24: 50 ug via INTRAVENOUS
  Administered 2014-09-24: 25 ug via INTRAVENOUS

## 2014-09-24 MED ORDER — ONDANSETRON HCL 4 MG PO TABS: 4.0000 mg | ORAL_TABLET | Freq: Four times a day (QID) | ORAL | Status: DC | PRN

## 2014-09-24 MED ORDER — HYDROCODONE-ACETAMINOPHEN 5-325 MG PO TABS
ORAL_TABLET | ORAL | Status: AC
  Filled 2014-09-24: qty 2

## 2014-09-24 MED ORDER — FENTANYL CITRATE 0.05 MG/ML IJ SOLN
INTRAMUSCULAR | Status: AC
  Filled 2014-09-24: qty 2

## 2014-09-24 SURGICAL SUPPLY — 80 items
BANDAGE ELASTIC 3 VELCRO ST LF (GAUZE/BANDAGES/DRESSINGS) IMPLANT
BANDAGE ELASTIC 6 VELCRO ST LF (GAUZE/BANDAGES/DRESSINGS) IMPLANT
BLADE DERMATOME II (BLADE) ×5 IMPLANT
BLADE SURG 10 STRL SS (BLADE) ×5 IMPLANT
BLADE SURG ROTATE 9660 (MISCELLANEOUS) IMPLANT
BNDG COHESIVE 1X5 TAN STRL LF (GAUZE/BANDAGES/DRESSINGS) IMPLANT
BNDG COHESIVE 4X5 TAN STRL (GAUZE/BANDAGES/DRESSINGS) IMPLANT
BNDG COHESIVE 6X5 TAN STRL LF (GAUZE/BANDAGES/DRESSINGS) IMPLANT
BNDG CONFORM 3 STRL LF (GAUZE/BANDAGES/DRESSINGS) IMPLANT
BNDG GAUZE ELAST 4 BULKY (GAUZE/BANDAGES/DRESSINGS) IMPLANT
BNDG GAUZE STRTCH 6 (GAUZE/BANDAGES/DRESSINGS) IMPLANT
CANISTER SUCTION 2500CC (MISCELLANEOUS) IMPLANT
CONT SPEC 4OZ CLIKSEAL STRL BL (MISCELLANEOUS) ×10 IMPLANT
CORDS BIPOLAR (ELECTRODE) IMPLANT
COVER SURGICAL LIGHT HANDLE (MISCELLANEOUS) ×5 IMPLANT
CUFF TOURNIQUET SINGLE 24IN (TOURNIQUET CUFF) IMPLANT
CUFF TOURNIQUET SINGLE 34IN LL (TOURNIQUET CUFF) IMPLANT
CUFF TOURNIQUET SINGLE 44IN (TOURNIQUET CUFF) IMPLANT
DERMACARRIERS GRAFT 1 TO 1.5 (DISPOSABLE) ×5
DRAPE EXTREMITY BILATERAL (DRAPE) IMPLANT
DRAPE IMP U-DRAPE 54X76 (DRAPES) IMPLANT
DRAPE INCISE IOBAN 66X45 STRL (DRAPES) ×5 IMPLANT
DRAPE SURG 17X23 STRL (DRAPES) IMPLANT
DRAPE U-SHAPE 47X51 STRL (DRAPES) ×10 IMPLANT
DRESSING TELFA 8X3 (GAUZE/BANDAGES/DRESSINGS) IMPLANT
DRSG ADAPTIC 3X8 NADH LF (GAUZE/BANDAGES/DRESSINGS) IMPLANT
DRSG PAD ABDOMINAL 8X10 ST (GAUZE/BANDAGES/DRESSINGS) IMPLANT
DRSG VAC ATS MED SENSATRAC (GAUZE/BANDAGES/DRESSINGS) ×5 IMPLANT
DURAPREP 26ML APPLICATOR (WOUND CARE) ×5 IMPLANT
ELECT CAUTERY BLADE 6.4 (BLADE) ×5 IMPLANT
ELECT REM PT RETURN 9FT ADLT (ELECTROSURGICAL)
ELECTRODE REM PT RTRN 9FT ADLT (ELECTROSURGICAL) IMPLANT
FACESHIELD WRAPAROUND (MASK) ×5 IMPLANT
GAUZE SPONGE 4X4 12PLY STRL (GAUZE/BANDAGES/DRESSINGS) IMPLANT
GAUZE XEROFORM 1X8 LF (GAUZE/BANDAGES/DRESSINGS) IMPLANT
GAUZE XEROFORM 5X9 LF (GAUZE/BANDAGES/DRESSINGS) IMPLANT
GLOVE BIOGEL PI IND STRL 6.5 (GLOVE) ×8 IMPLANT
GLOVE BIOGEL PI IND STRL 7.0 (GLOVE) ×4 IMPLANT
GLOVE BIOGEL PI INDICATOR 6.5 (GLOVE) ×2
GLOVE BIOGEL PI INDICATOR 7.0 (GLOVE) ×1
GLOVE ORTHO TXT STRL SZ7.5 (GLOVE) IMPLANT
GLOVE SURG SYN 7.5  E (GLOVE) ×2
GLOVE SURG SYN 7.5 E (GLOVE) ×8 IMPLANT
GOWN STRL REIN XL XLG (GOWN DISPOSABLE) ×5 IMPLANT
GOWN STRL REUS W/ TWL LRG LVL3 (GOWN DISPOSABLE) ×4 IMPLANT
GOWN STRL REUS W/TWL LRG LVL3 (GOWN DISPOSABLE) ×1
GRAFT DERMACARRIERS 1 TO 1.5 (DISPOSABLE) ×4 IMPLANT
HANDPIECE INTERPULSE COAX TIP (DISPOSABLE)
KIT BASIN OR (CUSTOM PROCEDURE TRAY) ×5 IMPLANT
KIT ROOM TURNOVER OR (KITS) ×5 IMPLANT
MANIFOLD NEPTUNE II (INSTRUMENTS) ×5 IMPLANT
NS IRRIG 1000ML POUR BTL (IV SOLUTION) ×10 IMPLANT
PACK ORTHO EXTREMITY (CUSTOM PROCEDURE TRAY) ×5 IMPLANT
PAD ABD 8X10 STRL (GAUZE/BANDAGES/DRESSINGS) IMPLANT
PAD ARMBOARD 7.5X6 YLW CONV (MISCELLANEOUS) ×10 IMPLANT
PAD NEG PRESSURE SENSATRAC (MISCELLANEOUS) ×5 IMPLANT
PADDING CAST ABS 4INX4YD NS (CAST SUPPLIES)
PADDING CAST ABS COTTON 4X4 ST (CAST SUPPLIES) IMPLANT
PADDING CAST COTTON 6X4 STRL (CAST SUPPLIES) IMPLANT
SET HNDPC FAN SPRY TIP SCT (DISPOSABLE) IMPLANT
SPONGE LAP 18X18 X RAY DECT (DISPOSABLE) IMPLANT
STAPLER VISISTAT 35W (STAPLE) IMPLANT
STOCKINETTE IMPERVIOUS 9X36 MD (GAUZE/BANDAGES/DRESSINGS) ×5 IMPLANT
SUT ETHILON 2 0 FS 18 (SUTURE) IMPLANT
SUT ETHILON 2 0 PSLX (SUTURE) IMPLANT
SUT ETHILON 3 0 PS 1 (SUTURE) IMPLANT
SUT MNCRL AB 4-0 PS2 18 (SUTURE) IMPLANT
SUT VIC AB 2-0 CT1 36 (SUTURE) IMPLANT
SUT VIC AB 2-0 FS1 27 (SUTURE) IMPLANT
SWAB COLLECTION DEVICE MRSA (MISCELLANEOUS) ×5 IMPLANT
SYR CONTROL 10ML LL (SYRINGE) IMPLANT
TOWEL OR 17X24 6PK STRL BLUE (TOWEL DISPOSABLE) ×5 IMPLANT
TOWEL OR 17X26 10 PK STRL BLUE (TOWEL DISPOSABLE) ×5 IMPLANT
TUBE ANAEROBIC SPECIMEN COL (MISCELLANEOUS) ×5 IMPLANT
TUBE CONNECTING 12X1/4 (SUCTIONS) ×5 IMPLANT
TUBE FEEDING 5FR 15 INCH (TUBING) IMPLANT
TUBING CYSTO DISP (UROLOGICAL SUPPLIES) ×5 IMPLANT
UNDERPAD 30X30 INCONTINENT (UNDERPADS AND DIAPERS) ×10 IMPLANT
WATER STERILE IRR 1000ML POUR (IV SOLUTION) ×5 IMPLANT
YANKAUER SUCT BULB TIP NO VENT (SUCTIONS) ×5 IMPLANT

## 2014-09-24 NOTE — Anesthesia Procedure Notes (Signed)
Procedure Name: LMA Insertion Date/Time: 09/24/2014 10:45 AM Performed by: Carmela RimaMARTINELLI, Jaishon Krisher F Pre-anesthesia Checklist: Patient identified, Timeout performed, Emergency Drugs available, Suction available and Patient being monitored Patient Re-evaluated:Patient Re-evaluated prior to inductionOxygen Delivery Method: Circle system utilized Preoxygenation: Pre-oxygenation with 100% oxygen Intubation Type: IV induction Ventilation: Mask ventilation without difficulty LMA: LMA inserted LMA Size: 4.0 Placement Confirmation: positive ETCO2 Tube secured with: Tape Dental Injury: Teeth and Oropharynx as per pre-operative assessment

## 2014-09-24 NOTE — H&P (Signed)
PREOPERATIVE H&P  Chief Complaint: Right ankle wound/Right Thigh Wound  HPI: Tyler Brock is a 23 y.o. male who presents for surgical treatment of Right ankle wound/Right Thigh Wound.  He denies any changes in medical history.  Past Medical History  Diagnosis Date  . ADD (attention deficit disorder)   . Gilbert's disease   . Jaundice of newborn   . Asthma     exercise induced - only one time.   . Sepsis 2015    [place]   Past Surgical History  Procedure Laterality Date  . I&d extremity Right 09/04/2014    Procedure: IRRIGATION AND DEBRIDEMENT RIGHT LEG;  Surgeon: Cheral AlmasNaiping Michael Xu, MD;  [facility];  Service: Orthopedics;  Laterality: Right;  . I&d extremity Right 09/05/2014    Procedure: IRRIGATION AND DEBRIDEMENT EXTREMITY, Closure of right inner thigh wound;  Surgeon: Cheral AlmasNaiping Michael Xu, MD;  Location: [facility] OR;  Service: Orthopedics;  Laterality: Right;  . Application of wound vac Right 09/05/2014    Procedure: APPLICATION OF WOUND VAC;  Surgeon: Cheral AlmasNaiping Michael Xu, MD;  Location: [facility] OR;  Service: Orthopedics;  Laterality: Right;  . I&d extremity Right 09/08/2014    Procedure: IRRIGATION AND DEBRIDEMENT RIGHT LEG WITH APPLICATION OF WOUND VAC;  Surgeon: Cheral AlmasNaiping Michael Xu, MD;  Location: [facility] OR;  Service: Orthopedics;  Laterality: Right;  . I&d extremity Right 09/17/2014    Procedure: IRRIGATION AND DEBRIDEMENT RIGHT ANKLE, INTEGRA PLACEMENT, WOUND VAC;  Surgeon: Cheral AlmasNaiping Michael Xu, MD;  Location: [facility] OR;  Service: Orthopedics;  Laterality: Right;  . Application of wound vac Right 09/17/2014    Procedure: APPLICATION OF WOUND VAC;  Surgeon: Cheral AlmasNaiping Michael Xu, MD;  Location: [facility] OR;  Service: Orthopedics;  Laterality: Right;   History   Social History  . Marital Status: Single    Spouse Name: N/A    Number of Children: N/A  . Years of Education: N/A   Social History Main Topics  . Smoking status: Never Smoker   . Smokeless tobacco:  Never Used  . Alcohol Use: Yes     Comment: socially  . Drug Use: No  . Sexual Activity: None   Other Topics Concern  . 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 Alcohol abuse Father   . Cancer Father     lung  . Arthritis Other   . Cancer Other     breast, prostate  . Hyperlipidemia Other   . Hypertension Other    Allergies  Allergen Reactions  . Codeine Sulfate Other (See Comments)    Childhood allergy  . Versed [Midazolam] Other (See Comments)    Becomes combative   Prior to Admission medications   Medication Sig Start Date End Date Taking? Authorizing Provider  cefTRIAXone 2 g in dextrose 5 % 50 mL Inject 2 g into the vein daily. 09/09/14 09/30/14 Yes Richarda OverlieNayana Abrol, MD  HYDROcodone-acetaminophen (NORCO) 5-325 MG per tablet Take 1-2 tablets by mouth every 6 (six) hours as needed (for pain). 09/10/14  Yes Kristian CoveyBruce W Burchette, MD  ondansetron (ZOFRAN) 8 MG tablet Take 1 tablet (8 mg total) by mouth every 8 (eight) hours as needed for nausea or vomiting. 09/03/14  Yes Kristian CoveyBruce W Burchette, MD  oxyCODONE (OXY  IR/ROXICODONE) 5 MG immediate release tablet Take 1-3 tablets (5-15 mg total) by mouth every 4 (four) hours as needed. 09/17/14  Yes Naiping Glee ArvinMichael Xu, MD  triamcinolone cream (KENALOG) 0.1 % Apply 1 application topically 2 (two) times daily as needed (for flare ups).   Yes Historical Provider, MD  docusate sodium 100 MG CAPS Take 100 mg by mouth 2 (two) times daily as needed for mild constipation. 09/09/14   Richarda OverlieNayana Abrol, MD  HYDROcodone-acetaminophen (NORCO) 5-325 MG per tablet Take 1-2 tablets by mouth every 6 (six) hours as needed. 09/17/14   Naiping Glee ArvinMichael Xu, MD  OxyCODONE HCl, Abuse Deter, 5 MG TABA Take 5 mg by mouth every 6 (six) hours as needed (PAIN).    Historical Provider, MD  senna (SENOKOT) 8.6 MG TABS tablet Take 1 tablet  (8.6 mg total) by mouth daily as needed for mild constipation. 09/09/14   Richarda OverlieNayana Abrol, MD  traZODone (DESYREL) 100 MG tablet Take 1 tablet (100 mg total) by mouth at bedtime as needed for sleep. 09/09/14   Richarda OverlieNayana Abrol, MD     Positive ROS: All other systems have been reviewed and were otherwise negative with the exception of those mentioned in the HPI and as above.  Physical Exam: General: Alert, no acute distress Cardiovascular: No pedal edema Respiratory: No cyanosis, no use of accessory musculature GI: abdomen soft Skin: No lesions in the area of chief complaint Neurologic: Sensation intact distally Psychiatric: Patient is competent for consent with normal mood and affect Lymphatic: no lymphedema  MUSCULOSKELETAL: exam stable  Assessment: Right ankle wound/Right Thigh Wound  Plan: Plan for Procedure(s): RIGHT ANKLE SPLIT THICKNESS SKIN GRAFT  RIGHT IRRIGATION AND DEBRIDEMENT EXTREMITY  The risks benefits and alternatives were discussed with the patient including but not limited to the risks of nonoperative treatment, versus surgical intervention including infection, bleeding, nerve injury,  blood clots, cardiopulmonary complications, morbidity, mortality, among others, and they were willing to proceed.   Cheral AlmasXu, Naiping Michael, MD   09/24/2014 10:18 AM

## 2014-09-24 NOTE — Anesthesia Postprocedure Evaluation (Signed)
  Anesthesia Post-op Note  Patient: Tyler Brock  Procedure(s) Performed: Procedure(s): IRRIGATION AND DEBRIDEMENT of RIGHT LOWER EXTREMITY (Right) APPLICATION OF WOUND VAC to Right Ankle and Right Thigh (Right) IRRIGATION AND DEBRIDEMENT of RIGHT THIGH WOUND (Right)  Patient Location: PACU  Anesthesia Type:General  Level of Consciousness: awake  Airway and Oxygen Therapy: Patient Spontanous Breathing  Post-op Pain: mild  Post-op Assessment: Post-op Vital signs reviewed  Post-op Vital Signs: Reviewed  Last Vitals:  Filed Vitals:   09/24/14 1159  BP: 141/65  Pulse: 102  Temp:   Resp: 16    Complications: No apparent anesthesia complications

## 2014-09-24 NOTE — Progress Notes (Signed)
ANTIBIOTIC CONSULT NOTE - INITIAL  Pharmacy Consult for Cefepime Indication: Wound infection  Allergies  Allergen Reactions  . Codeine Sulfate Other (See Comments)    Childhood allergy  . Versed [Midazolam] Other (See Comments)    Becomes combative    Patient Measurements: Weight: 207 lb (93.895 kg) Vital Signs: Temp: 97.9 F (36.6 C) (10/21 1538) Temp Source: Oral (10/21 0924) BP: 121/65 mmHg (10/21 1530) Pulse Rate: 71 (10/21 1538) Intake/Output from previous day:   Intake/Output from this shift:   Labs: No results found for this basename: WBC, HGB, PLT, LABCREA, CREATININE,  in the last 72 hours The CrCl is unknown because both a height and weight (above a minimum accepted value) are required for this calculation. No results found for this basename: VANCOTROUGH, Leodis Binet, VANCORANDOM, GENTTROUGH, GENTPEAK, GENTRANDOM, TOBRATROUGH, TOBRAPEAK, TOBRARND, AMIKACINPEAK, AMIKACINTROU, AMIKACIN,  in the last 72 hours   Microbiology: Recent Results (from the past 720 hour(s))  CULTURE, BLOOD (ROUTINE X 2)     Status: None   Collection Time    09/03/14  5:11 PM      Result Value Ref Range Status   Specimen Description BLOOD HAND RIGHT   Final   Special Requests BOTTLES DRAWN AEROBIC ONLY 10CC   Final   Culture  Setup Time     Final   Value: 09/04/2014 00:15     Performed at [facility]   Culture     Final   Value: NO GROWTH 5 DAYS     Performed at [facility]   Report Status 09/10/2014 FINAL   Final  CULTURE, BLOOD (ROUTINE X 2)     Status: None   Collection Time    09/03/14  6:23 PM      Result Value Ref Range Status   Specimen Description BLOOD ARM LEFT   Final   Special Requests BOTTLES DRAWN AEROBIC ONLY 5CC   Final   Culture  Setup Time     Final   Value: 09/04/2014 00:15     Performed at [facility]   Culture     Final   Value: NO GROWTH 5 DAYS     Performed at [facility]   Report Status 09/10/2014 FINAL   Final  URINE  CULTURE     Status: None   Collection Time    09/03/14  7:04 PM      Result Value Ref Range Status   Specimen Description URINE, CLEAN CATCH   Final   Special Requests NONE   Final   Culture  Setup Time     Final   Value: 09/03/2014 20:10     Performed at [facility] Count     Final   Value: NO GROWTH     Performed at [facility]   Culture     Final   Value: NO GROWTH     Performed at [facility]   Report Status 09/04/2014 FINAL   Final  MRSA PCR SCREENING     Status: None   Collection Time    09/03/14  9:54 PM      Result Value Ref Range Status   MRSA by PCR NEGATIVE  NEGATIVE Final   Comment:            The GeneXpert MRSA Assay (FDA     approved for NASAL specimens     only), is one component of a     comprehensive MRSA colonization  surveillance program. It is not     intended to diagnose MRSA     infection nor to guide or     monitor treatment for     MRSA infections.  ANAEROBIC CULTURE     Status: None   Collection Time    09/04/14  3:14 AM      Result Value Ref Range Status   Specimen Description ABSCESS RIGHT LEG   Final   Special Requests PT ON ZOSYN   Final   Gram Stain     Final   Value: MODERATE WBC PRESENT,BOTH PMN AND MONONUCLEAR     NO SQUAMOUS EPITHELIAL CELLS SEEN     MODERATE GRAM NEGATIVE RODS     [facility]   Culture     Final   Value: MULTIPLE ORGANISMS PRESENT, NONE PREDOMINANT     [facility] at [facility]   Report Status 09/09/2014 FINAL   Final  CULTURE, ROUTINE-ABSCESS     Status: None   Collection Time    09/04/14  3:26 AM      Result Value Ref Range Status   Specimen Description ABSCESS   Final   Special Requests RIGHT LEG PATIENT ON FOLLOWING ZOSYN   Final   Gram Stain     Final   Value: ABUNDANT WBC PRESENT,BOTH PMN AND MONONUCLEAR     NO SQUAMOUS EPITHELIAL CELLS SEEN     MODERATE GRAM NEGATIVE RODS     RARE GRAM POSITIVE RODS     RARE GRAM POSITIVE COCCI    Culture     Final   Value: MULTIPLE ORGANISMS PRESENT, NONE PREDOMINANT     Note: NO STAPHYLOCOCCUS AUREUS ISOLATED NO GROUP A STREP (S.PYOGENES) ISOLATED     Performed at [facility]   Report Status 09/07/2014 FINAL   Final  GRAM STAIN     Status: None   Collection Time    09/04/14  3:26 AM      Result Value Ref Range Status   Specimen Description ABSCESS   Final   Special Requests RIGHT LEG PATIENT ON FOLLOWING ZOYSN   Final   Gram Stain     Final   Value: ABUNDANT WBC PRESENT,BOTH PMN AND MONONUCLEAR     MODERATE GRAM NEGATIVE RODS     RARE GRAM POSITIVE RODS     RARE GRAM POSITIVE COCCI IN PAIRS     Gram Stain Report Called to,Read Back By and Verified With: DR. Coralyn MarkNAIPING [phone] GREEN R   Report Status 09/04/2014 FINAL   Final  CULTURE, ROUTINE-ABSCESS     Status: None   Collection Time    09/04/14  3:41 AM      Result Value Ref Range Status   Specimen Description ABSCESS   Final   Special Requests RIGHT FOOT PATIENT ON FOLLOWING ZOSYN   Final   Gram Stain     Final   Value: RARE WBC PRESENT, PREDOMINANTLY PMN     NO SQUAMOUS EPITHELIAL CELLS SEEN     NO ORGANISMS SEEN     [facility]formed at [facility]   Culture     Final   Value: MULTIPLE ORGANISMS PRESENT, NONE PREDOMINANT     Note: NO STAPHYLOCOCCUS AUREUS ISOLATED NO GROUP A STREP (S.PYOGENES) ISOLATED     [facility]formed at [facility]   Report Status 09/08/2014 FINAL   Final  GRAM STAIN     Status: None   Collection Time    09/04/14  3:41  AM      Result Value Ref Range Status   Specimen Description ABSCESS   Final   Special Requests RIGHT FOOT PATIENT ON FOLLOWING ZOSYN   Final   Gram Stain     Final   Value: FEW WBC PRESENT,BOTH PMN AND MONONUCLEAR     NO ORGANISMS SEEN     Gram Stain Report Called to,Read Back By and Verified With: DR. Coralyn Mark [id number] GREEN R   Report Status 09/04/2014 FINAL   Final  ANAEROBIC CULTURE     Status: None   Collection Time    09/04/14  3:41 AM      Result  Value Ref Range Status   Specimen Description ABSCESS RIGHT FOOT   Final   Special Requests PT ON ZOSYN   Final   Gram Stain     Final   Value: RARE WBC PRESENT,BOTH PMN AND MONONUCLEAR     NO SQUAMOUS EPITHELIAL CELLS SEEN     NO ORGANISMS SEEN     Performed at [facility]   Culture     Final   Value: FEW BACTEROIDES FRAGILIS     Note: BETA LACTAMASE POSITIVE     Performed at [facility]   Report Status 09/11/2014 FINAL   Final  CULTURE, ROUTINE-ABSCESS     Status: None   Collection Time    09/04/14  3:42 AM      Result Value Ref Range Status   Specimen Description ABSCESS   Final   Special Requests RIGHT FOOT PATIENT ON FOLLOWING ZOSYN   Final   Gram Stain     Final   Value: FEW WBC PRESENT,BOTH PMN AND MONONUCLEAR     NO SQUAMOUS EPITHELIAL CELLS SEEN     NO ORGANISMS SEEN     Gram Stain Report Called to,Read Back By and Verified With: Gram Stain Report Called to,Read Back By and Verified With: DR [facility] 09/04/14 0544 BY GREEN R Performed at [facility]     Performed at [facility]   Culture     Final   Value: MULTIPLE ORGANISMS PRESENT, NONE PREDOMINANT     Note: NO STAPHYLOCOCCUS AUREUS ISOLATED NO GROUP A STREP (S.PYOGENES) ISOLATED     Performed at [facility]   Report Status 09/07/2014 FINAL   Final  GRAM STAIN     Status: None   Collection Time    09/04/14  3:42 AM      Result Value Ref Range Status   Specimen Description ABSCESS   Final   Special Requests RIGHT FOOT PATIENT ON FOLLOWING ZOSYN   Final   Gram Stain     Final   Value: FEW WBC PRESENT,BOTH PMN AND MONONUCLEAR     NO ORGANISMS SEEN     Gram Stain Report Called to,Read Back By and Verified With: DR. Coralyn Mark [id number] GREEN R   Report Status 09/04/2014 FINAL   Final  ANAEROBIC CULTURE     Status: None   Collection Time    09/04/14  3:42 AM      Result Value Ref Range Status   Specimen Description ABSCESS RIGHT FOOT   Final   Special Requests PT ON ZOSYN    Final   Gram Stain     Final   Value: FEW WBC PRESENT,BOTH PMN AND MONONUCLEAR     NO SQUAMOUS EPITHELIAL CELLS SEEN     NO ORGANISMS SEEN     Performed at [facility]  Performed at [employer]     Final   Value: FEW BACTEROIDES FRAGILIS     Note: BETA LACTAMASE POSITIVE     Performed at [facility]   Report Status 09/11/2014 FINAL   Final  TISSUE CULTURE     Status: None   Collection Time    09/04/14  3:47 AM      Result Value Ref Range Status   Specimen Description TISSUE   Final   Special Requests RIGHT LEG PATIENT ON FOLLOWING ZOSYSN   Final   Gram Stain     Final   Value: ABUNDANT WBC PRESENT,BOTH PMN AND MONONUCLEAR     MODERATE GRAM NEGATIVE RODS     FEW GRAM POSITIVE COCCI     IN PAIRS RARE GRAM POSITIVE RODS     Performed at [facility]   Culture     Final   Value: MODERATE ESCHERICHIA COLI     MODERATE ENTEROBACTER AEROGENES     Performed at [facility]   Report Status 09/07/2014 FINAL   Final   Organism ID, Bacteria ESCHERICHIA COLI   Final   Organism ID, Bacteria ENTEROBACTER AEROGENES   Final  GRAM STAIN     Status: None   Collection Time    09/04/14  3:47 AM      Result Value Ref Range Status   Specimen Description TISSUE   Final   Special Requests RIGHT LEG PATIENT ON FOLLOWING ZOSYN   Final   Gram Stain     Final   Value: ABUNDANT WBC PRESENT,BOTH PMN AND MONONUCLEAR     MODERATE GRAM NEGATIVE RODS     FEW GRAM POSITIVE COCCI IN PAIRS     RARE GRAM POSITIVE RODS   Report Status 09/04/2014 FINAL   Final  CLOSTRIDIUM DIFFICILE BY PCR     Status: None   Collection Time    09/08/14 11:03 AM      Result Value Ref Range Status   C difficile by pcr NEGATIVE  NEGATIVE Final    Medical History: Past Medical History  Diagnosis Date  . ADD (attention deficit disorder)   . Gilbert's disease   . Jaundice of newborn   . Asthma     exercise induced - only one time.   . Sepsis 2015    [place] right [place]    Medications:  Anti-infectives   Start     Dose/Rate Route Frequency Ordered Stop   09/25/14 1100  cefTRIAXone (ROCEPHIN) 2 g in dextrose 5 % 50 mL IVPB  Status:  Discontinued     2 g 100 mL/hr over 30 Minutes Intravenous Every 24 hours 09/24/14 1537 09/24/14 1934   09/24/14 2100  cefTRIAXone (ROCEPHIN) 2 g in dextrose 5 % 50 mL IVPB  Status:  Discontinued     2 g 100 mL/hr over 30 Minutes Intravenous Every 24 hours 09/24/14 1934 09/24/14 1945   09/24/14 0600  ceFAZolin (ANCEF) IVPB 2 g/50 mL premix     2 g 100 mL/hr over 30 Minutes Intravenous On call to O.R. 09/23/14 1452 09/24/14 1054     Assessment: 23 year old male admitted 09/24/14 for I&D and skin graft of right ankle & right thigh wound to start Cefepime per pharmacy dosing. Patient is afebrile. He received pre-op Ancef at 10:54AM. Scr 0.99 from 10/6/ estimated CrCl > 100 mL/min. Labs pending this admission.   Goal of Therapy:  Clinical resolution  of infection  Plan:  1. Cefepime 1g IV q8h.  2. Monitor renal function and adjust therapy if needed.  3. Follow-up culture results.   Link SnufferJessica Raheen Capili, PharmD, BCPS Clinical Pharmacist [phone] 09/24/2014,7:49 PM

## 2014-09-24 NOTE — Progress Notes (Signed)
Orthopedic Tech Progress Note Patient Details:  Bethann BerkshireDaniel Langer 02-Dec-1991 [id number] No overhead frames at this time. Patient ID: Bethann BerkshireDaniel Kope, male   DOB: 02-Dec-1991, 23 y.o.   MRN: [id number]   Jennye MoccasinHughes, Sareen Randon Craig 09/24/2014, 6:24 PM

## 2014-09-24 NOTE — Progress Notes (Signed)
IV team paged and made aware of patients PICC. Dressing changed.

## 2014-09-24 NOTE — Op Note (Signed)
Date of surgery: 09/24/2014  Preoperative diagnosis: 1. Right ankle wound measuring 7 x 8 cm 2. Right thigh wound dehiscence  Postoperative diagnosis: Same  Procedure: 1. Debridement of muscle, subcutaneous tissue, skin of right ankle measuring 7 x 8 cm 2. debridement of subcutaneous tissue and skin of right thigh wound dehiscence 3. Application of negative wound therapy greater than 50 cm 4. Closure of complex right thigh wound by secondary intention  Surgeon: Glee ArvinMichael Xu, M.D.  Anesthesia: Gen.  Estimated blood loss: Minimal  Cultures: 1. Ankle wound cultures x2 2. Right thigh culture x1  Indications for procedure: Patient returns today for a planned irrigation debridement and possible skin grafting of the ankle wound. He is also indicated for irrigation debridement of the right thigh wound dehiscence. The risks benefits alternatives to surgery were again discussed and they elected to proceed  Description of procedure: Patient was identified in the preoperative holding area. Operative site was marked by the surgeon and confirmed with the patient. He is brought back to the operating room. His placed supine on the table. General anesthesia was induced. Right lower extremity was prepped and draped in standard sterile fashion. Preoperative antibiotics were given. A timeout was performed. We first began with the right ankle wound. There was an area of undermining and suspicious looking tissue around the 10:00 region. This was sharply debrided using a rongeur, knife, curet. The rest of the wound was also debrided. The rest of the wound appeared to be healthy with good viable beefy red tissue. Decision was made not to apply this split thickness skin graft given the potential for continued infection. 2 cultures were sent from the ankle. The wound was thoroughly irrigated using pulse lavage. A wound VAC was placed on the wound and set to -125 mm mercury. We then turned our attention to the right  thigh wound dehiscence. Using a tonsil the wound dehiscence was opened up. The dehiscence appear to be only superficial. Sharp excisional debridement of the skin and subcutaneous tissue was carried out. Once adequate debridement had been accomplished the wound was thoroughly irrigated with pulse lavage again. A wound VAC was placed on the right thigh wound also. The patient was extubated and transferred to the PACU in stable condition.  Postoperative plan: The patient will resume wound VAC changes every Monday Wednesday and Friday. We will plan on allowing the right thigh wound to close by secondary intention.  He will be admitted overnight and infectious disease will be reconsulted. We will follow the cultures.
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
## 2014-09-24 NOTE — Transfer of Care (Signed)
Immediate Anesthesia Transfer of Care Note  Patient: Tyler BerkshireDaniel Brock  Procedure(s) Performed: Procedure(s): IRRIGATION AND DEBRIDEMENT of RIGHT LOWER EXTREMITY (Right) APPLICATION OF WOUND VAC to Right Ankle and Right Thigh (Right) IRRIGATION AND DEBRIDEMENT of RIGHT THIGH WOUND (Right)  Patient Location: PACU  Anesthesia Type:General  Level of Consciousness: awake, alert  and oriented  Airway & Oxygen Therapy: Patient Spontanous Breathing and Patient connected to nasal cannula oxygen  Post-op Assessment: Report given to PACU RN, Post -op Vital signs reviewed and stable and Patient moving all extremities X 4  Post vital signs: Reviewed and stable  Complications: No apparent anesthesia complications

## 2014-09-24 NOTE — Anesthesia Preprocedure Evaluation (Addendum)
Anesthesia Evaluation  Patient identified by MRN, date of birth, ID band Patient awake    Reviewed: Allergy & Precautions, H&P , NPO status , Patient's Chart, lab work & pertinent test results  Airway Mallampati: II      Dental  (+) Teeth Intact, Dental Advidsory Given   Pulmonary asthma ,  breath sounds clear to auscultation        Cardiovascular negative cardio ROS  Rhythm:Regular Rate:Normal     Neuro/Psych PSYCHIATRIC DISORDERS    GI/Hepatic negative GI ROS, Neg liver ROS,   Endo/Other  negative endocrine ROS  Renal/GU negative Renal ROS     Musculoskeletal   Abdominal   Peds  Hematology   Anesthesia Other Findings   Reproductive/Obstetrics                          Anesthesia Physical Anesthesia Plan  ASA: II  Anesthesia Plan: General   Post-op Pain Management:    Induction: Intravenous  Airway Management Planned: LMA  Additional Equipment:   Intra-op Plan:   Post-operative Plan: Extubation in OR  Informed Consent: I have reviewed the patients History and Physical, chart, labs and discussed the procedure including the risks, benefits and alternatives for the proposed anesthesia with the patient or authorized representative who has indicated his/her understanding and acceptance.   Dental advisory given  Plan Discussed with: CRNA, Anesthesiologist and Surgeon  Anesthesia Plan Comments:        Anesthesia Quick Evaluation

## 2014-09-25 ENCOUNTER — Encounter (HOSPITAL_COMMUNITY): Payer: Self-pay | Admitting: Orthopaedic Surgery

## 2014-09-25 LAB — C-REACTIVE PROTEIN: CRP: 0.7 mg/dL — AB (ref <0.60)

## 2014-09-25 LAB — PREALBUMIN: Prealbumin: 26 mg/dL (ref 17.0–34.0)

## 2014-09-25 MED ORDER — HEPARIN SOD (PORK) LOCK FLUSH 100 UNIT/ML IV SOLN
250.0000 [IU] | INTRAVENOUS | Status: AC | PRN
  Administered 2014-09-25: 250 [IU]

## 2014-09-25 MED ORDER — SODIUM CHLORIDE 0.9 % IV SOLN: 1.0000 g | INTRAVENOUS | Status: DC

## 2014-09-25 NOTE — Progress Notes (Signed)
[facility]  Patient Status: Active pt with [facility] up to this admission  [facility] is providing the following services: HHRN and [facility] for [facility] ABX.  [facility] [name] to support DC back to home when deemed appropriate.  If patient discharges after hours, please call [phone].   Tyler Brock 09/25/2014, 8:52 AM

## 2014-09-25 NOTE — Evaluation (Addendum)
Physical Therapy Evaluation and Discharge Patient Details Name: Tyler Brock MRN: [id number] DOB: 1991-05-15 Today's Date: 09/25/2014   History of Present Illness  23 y.o. male s/p I & D of right ankle with Application of negative wound therapy   Clinical Impression  Patient evaluated by Physical Therapy with no further acute PT needs identified. All education has been completed and the patient has no further questions. Ambulates up to 150 feet with supervision while using a rolling walker. Safely completed stair training. Recommend follow up with outpatient PT to continue to progress functional skills.See below for any follow-up Physial Therapy or equipment needs. PT is signing off. Thank you for this referral.     Follow Up Recommendations Outpatient PT    Equipment Recommendations  None recommended by PT    Recommendations for Other Services       Precautions / Restrictions Precautions Precautions: Fall Restrictions Weight Bearing Restrictions: Yes RLE Weight Bearing: Weight bearing as tolerated      Mobility  Bed Mobility Overal bed mobility: Modified Independent                Transfers Overall transfer level: Needs assistance Equipment used: Rolling walker (2 wheeled) Transfers: Sit to/from Stand Sit to Stand: Supervision         General transfer comment: Supervision for safety. Correctly places hands on stable surface. No loss of balance or need for physical assistance  Ambulation/Gait Ambulation/Gait assistance: Supervision Ambulation Distance (Feet): 150 Feet Assistive device: Rolling walker (2 wheeled) Gait Pattern/deviations: Step-to pattern;Step-through pattern;Decreased step length - left;Decreased stance time - right;Antalgic;Trunk flexed   Gait velocity interpretation: Below normal speed for age/gender General Gait Details: Educated on safe DME use with a rolling walker. VC for upright posture with increased UEs use. No loss of balance noted.  Walker adjusted for appropriate height.  Stairs Stairs: Yes Stairs assistance: Min guard Stair Management: One rail Left;Step to pattern;Sideways Number of Stairs: 6 General stair comments: Instructions for correct sequencing and technique. Pt able to complete this task safely and verbalizes understanding.  Wheelchair Mobility    Modified Rankin (Stroke Patients Only)       Balance Overall balance assessment: Needs assistance Sitting-balance support: No upper extremity supported;Feet supported Sitting balance-Leahy Scale: Good     Standing balance support: No upper extremity supported Standing balance-Leahy Scale: Fair                               Pertinent Vitals/Pain Pain Assessment: 0-10 Pain Score: 2  Pain Location: RLE Pain Descriptors / Indicators: Aching Pain Intervention(s): Monitored during session;Repositioned    Home Living Family/patient expects to be discharged to:: Private residence Living Arrangements: Parent (mother and step-dad) Available Help at Discharge: Family;Available 24 hours/day Type of Home: House Home Access: Stairs to enter Entrance Stairs-Rails: None Entrance Stairs-Number of Steps: 1 Home Layout: Two level;1/2 bath on main level;Bed/bath upstairs Home Equipment: Crutches;Shower seat - built in;Walker - 2 wheels Additional Comments: family plans to turn family room into bedroom downstairs temporarily, WC will not fit in half bath    Prior Function Level of Independence: Independent               Hand Dominance   Dominant Hand: Right    Extremity/Trunk Assessment   Upper Extremity Assessment: Defer to OT evaluation           Lower Extremity Assessment: RLE deficits/detail RLE Deficits / Details: Decreased strength and  ROM of ankle secondary to pain. Moderately edematous with erythema. Decreased light touch sensation below surgical site extending distally along lateral border of foot.       Communication    Communication: No difficulties  Cognition Arousal/Alertness: Awake/alert Behavior During Therapy: WFL for tasks assessed/performed Overall Cognitive Status: Within Functional Limits for tasks assessed                      General Comments General comments (skin integrity, edema, etc.): Edema and erythema Rt ankle and foot. Practiced donning/doffing CAM boot.    Exercises General Exercises - Lower Extremity Ankle Circles/Pumps: AROM;Both;10 reps      Assessment/Plan    PT Assessment All further PT needs can be met in the next venue of care  PT Diagnosis Difficulty walking;Abnormality of gait;Acute pain   PT Problem List Decreased strength;Decreased range of motion;Decreased activity tolerance;Decreased balance;Decreased mobility;Decreased knowledge of use of DME;Impaired sensation;Pain  PT Treatment Interventions     PT Goals (Current goals can be found in the Care Plan section) Acute Rehab PT Goals Patient Stated Goal: to get back to school PT Goal Formulation: All assessment and education complete, DC therapy    Frequency     Barriers to discharge        Co-evaluation               End of Session Equipment Utilized During Treatment: Gait belt (CAM boot Rt) Activity Tolerance: Patient tolerated treatment well Patient left: in bed;with call bell/phone within reach;with family/visitor present Nurse Communication: Mobility status         Time: 3750-5107 PT Time Calculation (min): 44 min   Charges:   PT Evaluation $Initial PT Evaluation Tier I: 1 Procedure PT Treatments $Gait Training: 8-22 mins $Self Care/Home Management: 8-22   PT G Codes:         Elayne Snare, [place]  Ellouise Newer 09/25/2014, 12:41 PM  Addendum for discharge in title

## 2014-09-25 NOTE — Progress Notes (Signed)
   Subjective:  Patient reports pain as mild.    Objective:   VITALS:   Filed Vitals:   09/24/14 2013 09/24/14 2100 09/25/14 0127 09/25/14 0544  BP: 106/50  117/56 111/57  Pulse: 78  73 72  Temp: 98 F (36.7 C)  98.1 F (36.7 C) 98.1 F (36.7 C)  TempSrc: Oral  Oral Oral  Resp:      Height:  5' 10.87" (1.8 m)    Weight:      SpO2: 96%  98% 98%    VAC with good seal and suction   Lab Results  Component Value Date   WBC 5.5 09/24/2014   HGB 11.1* 09/24/2014   HCT 32.7* 09/24/2014   MCV 86.3 09/24/2014   PLT 299 09/24/2014     Assessment/Plan:  1 Day Post-Op   - will need CM/SW assistance [facility] and wound VAC changes - will now resume wound vac changes MWF - IV abx will be changed to 1 gm ertapenem IV q24h per ID recs - WBAT in fx boot - up with PT - d/c home today once everything has been done - please call with questions  Cheral AlmasXu, Daxen Lanum Michael 09/25/2014, 7:58 AM [phone]

## 2014-09-25 NOTE — Care Management Note (Signed)
CARE MANAGEMENT NOTE 09/25/2014  Patient:  Tyler Brock,Tyler Brock   Account Number:  [id number]  Date Initiated:  09/25/2014  Documentation initiated by:  Vance PeperBRADY,Devi Hopman  Subjective/Objective Assessment:   23 yr old male admitted for I & D of right ankle wound and right thigh deheisced wound, with  re-application of wound vac.     Action/Plan:   Patient is active with [facility]. Will d/c on new antibiotics, [facility] IV  nurse notified.  No DME or other HH needs identified.   Anticipated DC Date:  09/25/2014   Anticipated DC Plan:  HOME W HOME HEALTH SERVICES      DC Planning Services  CM consult      [facility]  [facility] Of [facility]/PTA Provider   Choice offered to / List presented to:  C-1 Patient   DME arranged  VAC      DME agency  [facility]     HH arranged  IV Antibiotics  HH-1 RN      [facility]   Status of service:  Completed, signed off Medicare Important Message given?   (If response is "NO", the following Medicare IM given date fields will be blank) Date Medicare IM given:   Medicare IM given by:   Date Additional Medicare IM given:   Additional Medicare IM given by:    Discharge Disposition:  HOME W HOME HEALTH SERVICES  Per UR Regulation:  Reviewed for med. necessity/level of care/duration of stay

## 2014-09-25 NOTE — Progress Notes (Signed)
Orthopedic Tech Progress Note Patient Details:  Tyler BerkshireDaniel Brock Aug 16, 1991 [id number] Delivered Cam Walker to pt.'s physical therapist. Ortho Devices Type of Ortho Device: CAM walker Ortho Device/Splint Location: RLE Ortho Device/Splint Interventions: Other (comment)   Lesle ChrisGilliland, Elleen Coulibaly L 09/25/2014, 10:48 AM

## 2014-09-25 NOTE — Progress Notes (Signed)
Utilization review completed.  

## 2014-09-25 NOTE — Progress Notes (Signed)
OT Cancellation Note  Patient Details Name: Tyler BerkshireDaniel Brock MRN: [id number] DOB: 08-12-1991   Cancelled Treatment:    Reason Eval/Treat Not Completed: OT screened, no needs identified, will sign off. Pt known to me from last admission and he/mother report no issues they can think of with BADLS, managing at home pta and now can put weight on RLE.   Evette GeorgesLeonard, Halona Amstutz Eva [phone] 09/25/2014, 1:32 PM

## 2014-09-25 NOTE — Progress Notes (Signed)
Patient discharged to home accompanied by family. Discharge instructions, follow up information, and rx given and explained and patient stated understanding. PICC like was heparin locked for discharge home. Patient left unit in a stable condition with all personal belongings via wheelchair.

## 2014-09-26 ENCOUNTER — Encounter (HOSPITAL_COMMUNITY): Payer: Self-pay | Admitting: Orthopaedic Surgery
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
## 2014-09-27 LAB — WOUND CULTURE

## 2014-09-27 LAB — TISSUE CULTURE

## 2014-09-28 LAB — TISSUE CULTURE: Culture: NO GROWTH

## 2014-09-29 LAB — ANAEROBIC CULTURE

## 2014-10-01 ENCOUNTER — Encounter: Payer: Self-pay | Admitting: Family Medicine

## 2014-10-03 ENCOUNTER — Emergency Department (HOSPITAL_COMMUNITY)
Admission: EM | Admit: 2014-10-03 | Discharge: 2014-10-04 | Disposition: A | Discharge status: Home / Self Care | Payer: BC Managed Care – PPO | Attending: Emergency Medicine | Admitting: Emergency Medicine

## 2014-10-03 ENCOUNTER — Encounter (HOSPITAL_COMMUNITY): Payer: Self-pay | Admitting: Emergency Medicine

## 2014-10-03 DIAGNOSIS — Z8639 Personal history of other endocrine, nutritional and metabolic disease: Secondary | ICD-10-CM | POA: Diagnosis not present

## 2014-10-03 DIAGNOSIS — Y848 Other medical procedures as the cause of abnormal reaction of the patient, or of later complication, without mention of misadventure at the time of the procedure: Secondary | ICD-10-CM | POA: Insufficient documentation

## 2014-10-03 DIAGNOSIS — Z8619 Personal history of other infectious and parasitic diseases: Secondary | ICD-10-CM | POA: Insufficient documentation

## 2014-10-03 DIAGNOSIS — T82598A Other mechanical complication of other cardiac and vascular devices and implants, initial encounter: Secondary | ICD-10-CM | POA: Diagnosis not present

## 2014-10-03 DIAGNOSIS — Z7952 Long term (current) use of systemic steroids: Secondary | ICD-10-CM | POA: Diagnosis not present

## 2014-10-03 DIAGNOSIS — J45909 Unspecified asthma, uncomplicated: Secondary | ICD-10-CM | POA: Diagnosis not present

## 2014-10-03 DIAGNOSIS — Z8659 Personal history of other mental and behavioral disorders: Secondary | ICD-10-CM | POA: Insufficient documentation

## 2014-10-03 DIAGNOSIS — Z79899 Other long term (current) drug therapy: Secondary | ICD-10-CM | POA: Insufficient documentation

## 2014-10-03 DIAGNOSIS — T82898A Other specified complication of vascular prosthetic devices, implants and grafts, initial encounter: Secondary | ICD-10-CM

## 2014-10-03 DIAGNOSIS — T82518A Breakdown (mechanical) of other cardiac and vascular devices and implants, initial encounter: Secondary | ICD-10-CM | POA: Diagnosis present

## 2014-10-03 NOTE — Progress Notes (Signed)
IV Team called to assess Left single lumen PICC. Found capp broken and a piece of the capp lodged inside the lumen. Recommend PICC to be replaced or exchanged. Eliot FordSarah Marleah Beever RN [facility].

## 2014-10-03 NOTE — ED Notes (Signed)
Spoke with IV team and sts seeing pt in triage. Aware of situation.

## 2014-10-03 NOTE — ED Notes (Signed)
Pt here for his picc line being broken. Plastic piece on the outside being broken.

## 2014-10-03 NOTE — ED Notes (Signed)
IV team at bedside 

## 2014-10-03 NOTE — ED Notes (Signed)
Pt. seen and evaluated by IV nurse at triage waiting area.

## 2014-10-03 NOTE — ED Provider Notes (Signed)
CSN: [id number]     Arrival date & time 10/03/14  1834 History   First MD Initiated Contact with Patient 10/03/14 2222     Chief Complaint  Patient presents with  . picc line broken      (Consider location/radiation/quality/duration/timing/severity/associated sxs/prior Treatment) HPI Patient reports that his PICC line broke at the port of entry sometime this afternoon. No other complaint. He is presently getting intravenous antibiotics through PICC line. Next dose is scheduled for tomorrow morning. No treatment prior to coming here PICC line was found to be occluded when his mother attempted to administer medication Past Medical History  Diagnosis Date  . ADD (attention deficit disorder)   . Gilbert's disease   . Jaundice of newborn   . Asthma     exercise induced - only one time.   . Sepsis 2015    From right ankle wound [place].   Past Surgical History  Procedure Laterality Date  . I&d extremity Right 09/04/2014    Procedure: IRRIGATION AND DEBRIDEMENT RIGHT LEG;  Surgeon: Cheral AlmasNaiping Michael Xu, MD;  [facility];  Service: Orthopedics;  Laterality: Right;  . I&d extremity Right 09/05/2014    Procedure: IRRIGATION AND DEBRIDEMENT EXTREMITY, Closure of right inner thigh wound;  Surgeon: Cheral AlmasNaiping Michael Xu, MD;  Location: [facility] OR;  Service: Orthopedics;  Laterality: Right;  . Application of wound vac Right 09/05/2014    Procedure: APPLICATION OF WOUND VAC;  Surgeon: Cheral AlmasNaiping Michael Xu, MD;  Location: [facility] OR;  Service: Orthopedics;  Laterality: Right;  . I&d extremity Right 09/08/2014    Procedure: IRRIGATION AND DEBRIDEMENT RIGHT LEG WITH APPLICATION OF WOUND VAC;  Surgeon: Cheral AlmasNaiping Michael Xu, MD;  Location: [facility] OR;  Service: Orthopedics;  Laterality: Right;  . I&d extremity Right 09/17/2014    Procedure: IRRIGATION AND DEBRIDEMENT RIGHT ANKLE, INTEGRA PLACEMENT, WOUND VAC;  Surgeon: Cheral AlmasNaiping Michael Xu, MD;  Location: [facility] OR;  Service: Orthopedics;  Laterality: Right;  .  Application of wound vac Right 09/17/2014    Procedure: APPLICATION OF WOUND VAC;  Surgeon: Cheral AlmasNaiping Michael Xu, MD;  Location: [facility] OR;  Service: Orthopedics;  Laterality: Right;  . I&d extremity Right 09/24/2014    Procedure: IRRIGATION AND DEBRIDEMENT of RIGHT LOWER EXTREMITY;  Surgeon: Cheral AlmasNaiping Michael Xu, MD;  Location: [facility] OR;  Service: Orthopedics;  Laterality: Right;  . Application of wound vac Right 09/24/2014    Procedure: APPLICATION OF WOUND VAC to Right Ankle and Right Thigh;  Surgeon: Cheral AlmasNaiping Michael Xu, MD;  Location: [facility] OR;  Service: Orthopedics;  Laterality: Right;  . Incision and drainage of wound Right 09/24/2014    Procedure: IRRIGATION AND DEBRIDEMENT of RIGHT THIGH WOUND;  Surgeon: Cheral AlmasNaiping Michael Xu, MD;  Location: [facility] OR;  Service: Orthopedics;  Laterality: Right;   Family History  Problem Relation Age of Onset  . Alcohol abuse Father   . Cancer Father     lung  . Arthritis Other   . Cancer Other     breast, prostate  . Hyperlipidemia Other   . Hypertension Other    History  Substance Use Topics  . Smoking status: Never Smoker   . Smokeless tobacco: Never Used  . Alcohol Use: Yes     Comment: socially    Review of Systems  Skin: Positive for wound.       Receiving intravenous antibiotics for wounds at right ankle and right inner thigh  All other systems reviewed and are negative.     Allergies  Codeine sulfate and Versed  Home Medications   Prior to Admission medications   Medication Sig Start Date End Date Taking? Authorizing Provider  docusate sodium 100 MG CAPS Take 100 mg by mouth 2 (two) times daily as needed for mild constipation. 09/09/14   Richarda OverlieNayana Abrol, MD  ertapenem 1 g in sodium chloride 0.9 % 50 mL Inject 1 g into the vein daily. 09/25/14   Naiping Glee ArvinMichael Xu, MD  HYDROcodone-acetaminophen (NORCO) 5-325 MG per tablet Take 1-2 tablets by mouth every 6 (six) hours as needed (for pain). 09/10/14   Kristian CoveyBruce W Burchette, MD  HYDROcodone-acetaminophen  (NORCO) 5-325 MG per tablet Take 1-2 tablets by mouth every 6 (six) hours as needed. 09/17/14   Naiping Glee ArvinMichael Xu, MD  ondansetron (ZOFRAN) 8 MG tablet Take 1 tablet (8 mg total) by mouth every 8 (eight) hours as needed for nausea or vomiting. 09/03/14   Kristian CoveyBruce W Burchette, MD  oxyCODONE (OXY IR/ROXICODONE) 5 MG immediate release tablet Take 1-3 tablets (5-15 mg total) by mouth every 4 (four) hours as needed. 09/17/14   Naiping Glee ArvinMichael Xu, MD  OxyCODONE HCl, Abuse Deter, 5 MG TABA Take 5 mg by mouth every 6 (six) hours as needed (PAIN).    Historical Provider, MD  senna (SENOKOT) 8.6 MG TABS tablet Take 1 tablet (8.6 mg total) by mouth daily as needed for mild constipation. 09/09/14   Richarda OverlieNayana Abrol, MD  traZODone (DESYREL) 100 MG tablet Take 1 tablet (100 mg total) by mouth at bedtime as needed for sleep. 09/09/14   Richarda OverlieNayana Abrol, MD  triamcinolone cream (KENALOG) 0.1 % Apply 1 application topically 2 (two) times daily as needed (for flare ups).    Historical Provider, MD   BP 115/58  Pulse 73  Temp(Src) 98.4 F (36.9 C) (Oral)  Resp 18  SpO2 99% Physical Exam  Nursing note and vitals reviewed. Constitutional: He appears well-developed and well-nourished. No distress.  HENT:  Head: Normocephalic and atraumatic.  Right Ear: External ear normal.  Left Ear: External ear normal.  Eyes: Conjunctivae are normal. Pupils are equal, round, and reactive to light.  Neck: No tracheal deviation present.  Cardiovascular: Normal rate.   Pulmonary/Chest: Effort normal.  Abdominal: Soft. Bowel sounds are normal. He exhibits no distension. There is no tenderness.  Musculoskeletal: Normal range of motion. He exhibits no edema and no tenderness.  Left upper extremity PICC line in place overlying upper arm. Port is closed off. Skin is intact no tenderness at insertion site. Walking boot on right leg. All other extremities without redness swelling or tenderness  Neurological: He is alert. Coordination normal.   Skin: Skin is warm and dry. No rash noted.  Psychiatric: He has a normal mood and affect. His behavior is normal.    ED Course  Procedures (including critical care time) Labs Review Labs Reviewed - No data to display  Imaging Review No results found.   EKG Interpretation None     nurse attempted to irrigate PICC line and found it to be occluded PICC line was exchanged by IV team nurse MDM  Patient stable for discharge and further outpatient treatment with intravenous antibiotics PICC line occlusion Final diagnoses:  None        Doug SouSam Kamir Selover, MD 10/04/14 0025

## 2014-10-03 NOTE — ED Notes (Signed)
IV team at bedside to exchange PICC line

## 2014-10-04 NOTE — Discharge Instructions (Signed)
PICC Home Guide A peripherally inserted central catheter (PICC) is a long, thin, flexible tube that is inserted into a vein in the upper arm. It is a form of intravenous (IV) access. It is considered to be a "central" line because the tip of the PICC ends in a large vein in your chest. This large vein is called the superior vena cava (SVC). The PICC tip ends in the SVC because there is a lot of blood flow in the SVC. This allows medicines and IV fluids to be quickly distributed throughout the body. The PICC is inserted using a sterile technique by a specially trained nurse or physician. After the PICC is inserted, a chest X-ray exam is done to be sure it is in the correct place.  A PICC may be placed for different reasons, such as:  To give medicines and liquid nutrition that can only be given through a central line. Examples are:  Certain antibiotic treatments.  Chemotherapy.  Total parenteral nutrition (TPN).  To take frequent blood samples.  To give IV fluids and blood products.  If there is difficulty placing a peripheral intravenous (PIV) catheter. If taken care of properly, a PICC can remain in place for several months. A PICC can also allow a person to go home from the hospital early. Medicine and PICC care can be managed at home by a family member or home health care team. WHAT PROBLEMS CAN HAPPEN WHEN I HAVE A PICC? Problems with a PICC can occasionally occur. These may include the following:  A blood clot (thrombus) forming in or at the tip of the PICC. This can cause the PICC to become clogged. A clot-dissolving medicine called tissue plasminogen activator (tPA) can be given through the PICC to help break up the clot.  Inflammation of the vein (phlebitis) in which the PICC is placed. Signs of inflammation may include redness, pain at the insertion site, red streaks, or being able to feel a "cord" in the vein where the PICC is located.  Infection in the PICC or at the insertion  site. Signs of infection may include fever, chills, redness, swelling, or pus drainage from the PICC insertion site.  PICC movement (malposition). The PICC tip may move from its original position due to excessive physical activity, forceful coughing, sneezing, or vomiting.  A break or cut in the PICC. It is important to not use scissors near the PICC.  Nerve or tendon irritation or injury during PICC insertion. WHAT SHOULD I KEEP IN MIND ABOUT ACTIVITIES WHEN I HAVE A PICC?  You may bend your arm and move it freely. If your PICC is near or at the bend of your elbow, avoid activity with repeated motion at the elbow.  Rest at home for the remainder of the day following PICC line insertion.  Avoid lifting heavy objects as instructed by your health care provider.  Avoid using a crutch with the arm on the same side as your PICC. You may need to use a walker. WHAT SHOULD I KNOW ABOUT MY PICC DRESSING?  Keep your PICC bandage (dressing) clean and dry to prevent infection.  Ask your health care provider when you may shower. Ask your health care provider to teach you how to wrap the PICC when you do take a shower.  Change the PICC dressing as instructed by your health care provider.  Change your PICC dressing if it becomes loose or wet. WHAT SHOULD I KNOW ABOUT PICC CARE?  Check the PICC insertion site   daily for leakage, redness, swelling, or pain.  Do not take a bath, swim, or use hot tubs when you have a PICC. Cover PICC line with clear plastic wrap and tape to keep it dry while showering.  Flush the PICC as directed by your health care provider. Let your health care provider know right away if the PICC is difficult to flush or does not flush. Do not use force to flush the PICC.  Do not use a syringe that is less than 10 mL to flush the PICC.  Never pull or tug on the PICC.  Avoid blood pressure checks on the arm with the PICC.  Keep your PICC identification card with you at all  times.  Do not take the PICC out yourself. Only a trained clinical professional should remove the PICC. SEEK IMMEDIATE MEDICAL CARE IF:  Your PICC is accidentally pulled all the way out. If this happens, cover the insertion site with a bandage or gauze dressing. Do not throw the PICC away. Your health care provider will need to inspect it.  Your PICC was tugged or pulled and has partially come out. Do not  push the PICC back in.  There is any type of drainage, redness, or swelling where the PICC enters the skin.  You cannot flush the PICC, it is difficult to flush, or the PICC leaks around the insertion site when it is flushed.  You hear a "flushing" sound when the PICC is flushed.  You have pain, discomfort, or numbness in your arm, shoulder, or jaw on the same side as the PICC.  You feel your heart "racing" or skipping beats.  You notice a hole or tear in the PICC.  You develop chills or a fever. MAKE SURE YOU:   Understand these instructions.  Will watch your condition.  Will get help right away if you are not doing well or get worse. Document Released: 05/28/2003 Document Revised: 04/07/2014 Document Reviewed: 07/29/2013 Exit[employer] Patient Information 2015 [employer]. This information is not intended to replace advice given to you by your health care provider. Make sure you discuss any questions you have with your health care provider.  

## 2014-10-04 NOTE — Progress Notes (Signed)
Peripherally Inserted Central Catheter/Midline Placement  The IV Nurse has discussed with the patient and/or persons authorized to consent for the patient, the purpose of this procedure and the potential benefits and risks involved with this procedure.  The benefits include less needle sticks, lab draws from the catheter and patient may be discharged home with the catheter.  Risks include, but not limited to, infection, bleeding, blood clot (thrombus formation), and puncture of an artery; nerve damage and irregular heat beat.  Alternatives to this procedure were also discussed.  PICC/Midline Placement Documentation  PICC / Midline Single Lumen 10/04/14 PICC Left Basilic 42 cm 0 cm (Active)  Indication for Insertion or Continuance of Line Home intravenous therapies (PICC only) 10/04/2014 12:29 AM  Exposed Catheter (cm) 0 cm 10/04/2014 12:29 AM  Site Assessment Clean;Dry;Intact 10/04/2014 12:29 AM  Line Status Heparin locked (Peds) 10/04/2014 12:29 AM  Dressing Type Transparent 10/04/2014 12:29 AM  Dressing Status Clean;Dry;Intact;Antimicrobial disc in place 10/04/2014 12:29 AM  Dressing Change Due 10/11/14 10/04/2014 12:29 AM       Netta Corriganhomas, Javonta Gronau L 10/04/2014, 12:30 AM

## 2014-10-06 ENCOUNTER — Other Ambulatory Visit (HOSPITAL_COMMUNITY): Payer: Self-pay | Admitting: Orthopaedic Surgery

## 2014-10-07 ENCOUNTER — Encounter (HOSPITAL_COMMUNITY): Payer: Self-pay | Admitting: *Deleted

## 2014-10-07 MED ORDER — CEFAZOLIN SODIUM-DEXTROSE 2-3 GM-% IV SOLR
2.0000 g | INTRAVENOUS | Status: DC
  Filled 2014-10-07: qty 50

## 2014-10-07 NOTE — H&P (Signed)
PREOPERATIVE H&P  Chief Complaint: Right ankle and thigh wound  HPI: Tyler Brock is a 23 y.o. male who presents for surgical treatment of Right ankle and thigh wound.  He denies any changes in medical history.  Past Medical History  Diagnosis Date  . ADD (attention deficit disorder)   . Gilbert's disease   . Jaundice of newborn   . Sepsis 2015    [place]  . Asthma     exercise induced - only one time.    Past Surgical History  Procedure Laterality Date  . I&d extremity Right 09/04/2014    Procedure: IRRIGATION AND DEBRIDEMENT RIGHT LEG;  Surgeon: Cheral AlmasNaiping Michael Xu, MD;  [facility];  Service: Orthopedics;  Laterality: Right;  . I&d extremity Right 09/05/2014    Procedure: IRRIGATION AND DEBRIDEMENT EXTREMITY, Closure of right inner thigh wound;  Surgeon: Cheral AlmasNaiping Michael Xu, MD;  Location: [facility] OR;  Service: Orthopedics;  Laterality: Right;  . Application of wound vac Right 09/05/2014    Procedure: APPLICATION OF WOUND VAC;  Surgeon: Cheral AlmasNaiping Michael Xu, MD;  Location: [facility] OR;  Service: Orthopedics;  Laterality: Right;  . I&d extremity Right 09/08/2014    Procedure: IRRIGATION AND DEBRIDEMENT RIGHT LEG WITH APPLICATION OF WOUND VAC;  Surgeon: Cheral AlmasNaiping Michael Xu, MD;  Location: [facility] OR;  Service: Orthopedics;  Laterality: Right;  . I&d extremity Right 09/17/2014    Procedure: IRRIGATION AND DEBRIDEMENT RIGHT ANKLE, INTEGRA PLACEMENT, WOUND VAC;  Surgeon: Cheral AlmasNaiping Michael Xu, MD;  Location: [facility] OR;  Service: Orthopedics;  Laterality: Right;  . Application of wound vac Right 09/17/2014    Procedure: APPLICATION OF WOUND VAC;  Surgeon: Cheral AlmasNaiping Michael Xu, MD;  Location: [facility] OR;  Service: Orthopedics;  Laterality: Right;  . I&d extremity Right 09/24/2014    Procedure: IRRIGATION AND DEBRIDEMENT of RIGHT LOWER EXTREMITY;  Surgeon: Cheral AlmasNaiping Michael Xu, MD;  Location: [facility] OR;  Service: Orthopedics;  Laterality: Right;  . Application of wound vac Right 09/24/2014     Procedure: APPLICATION OF WOUND VAC to Right Ankle and Right Thigh;  Surgeon: Cheral AlmasNaiping Michael Xu, MD;  Location: [facility] OR;  Service: Orthopedics;  Laterality: Right;  . Incision and drainage of wound Right 09/24/2014    Procedure: IRRIGATION AND DEBRIDEMENT of RIGHT THIGH WOUND;  Surgeon: Cheral AlmasNaiping Michael Xu, MD;  Location: [facility] OR;  Service: Orthopedics;  Laterality: Right;   History   Social History  . Marital Status: Single    Spouse Name: N/A    Number of Children: N/A  . Years of Education: N/A   Social History Main Topics  . Smoking status: Never Smoker   . Smokeless tobacco: Never Used  . Alcohol Use: Yes     Comment: socially  . Drug Use: No  . Sexual Activity: None   Other Topics Concern  . 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 Alcohol abuse Father   . Cancer Father     lung  . Arthritis Other   . Cancer Other     breast, prostate  . Hyperlipidemia Other   . Hypertension Other    Allergies  Allergen Reactions  . Codeine Sulfate Other (See Comments)    Childhood allergy  . Versed [Midazolam] Other (See  Comments)    Becomes combative   Prior to Admission medications   Medication Sig Start Date End Date Taking? Authorizing Provider  docusate sodium 100 MG CAPS Take 100 mg by mouth 2 (two) times daily as needed for mild constipation. Patient taking differently: Take 100 mg by mouth daily as needed (For constipation).  09/09/14  Yes Richarda OverlieNayana Abrol, MD  ertapenem 1 g in sodium chloride 0.9 % 50 mL Inject 1 g into the vein daily. 09/25/14  Yes Naiping Glee ArvinMichael Xu, MD  HYDROcodone-acetaminophen (NORCO) 5-325 MG per tablet Take 1-2 tablets by mouth every 6 (six) hours as needed (for pain). Patient taking differently: Take 2 tablets by mouth every 6 (six) hours as needed for severe pain (for pain).  09/10/14  Yes Kristian CoveyBruce W Burchette,  MD  ondansetron (ZOFRAN) 8 MG tablet Take 1 tablet (8 mg total) by mouth every 8 (eight) hours as needed for nausea or vomiting. 09/03/14  Yes Kristian CoveyBruce W Burchette, MD  HYDROcodone-acetaminophen (NORCO) 5-325 MG per tablet Take 1-2 tablets by mouth every 6 (six) hours as needed. Patient taking differently: Take 2 tablets by mouth every 6 (six) hours as needed.  09/17/14   Naiping Glee ArvinMichael Xu, MD  oxyCODONE (OXY IR/ROXICODONE) 5 MG immediate release tablet Take 1-3 tablets (5-15 mg total) by mouth every 4 (four) hours as needed. 09/17/14   Naiping Glee ArvinMichael Xu, MD  OxyCODONE HCl, Abuse Deter, 5 MG TABA Take 5 mg by mouth every 6 (six) hours as needed (PAIN).    Historical Provider, MD  senna (SENOKOT) 8.6 MG TABS tablet Take 1 tablet (8.6 mg total) by mouth daily as needed for mild constipation. 09/09/14   Richarda OverlieNayana Abrol, MD  traZODone (DESYREL) 100 MG tablet Take 1 tablet (100 mg total) by mouth at bedtime as needed for sleep. 09/09/14   Richarda OverlieNayana Abrol, MD  triamcinolone cream (KENALOG) 0.1 % Apply 1 application topically 2 (two) times daily as needed (for flare ups).    Historical Provider, MD     Positive ROS: All other systems have been reviewed and were otherwise negative with the exception of those mentioned in the HPI and as above.  Physical Exam: General: Alert, no acute distress Cardiovascular: No pedal edema Respiratory: No cyanosis, no use of accessory musculature GI: abdomen soft Skin: No lesions in the area of chief complaint Neurologic: Sensation intact distally Psychiatric: Patient is competent for consent with normal mood and affect Lymphatic: no lymphedema  MUSCULOSKELETAL:  Exam stable  Assessment: Right ankle and thigh wound  Plan: Plan for Procedure(s): IRRIGATION AND DEBRIDEMENT AND SPLIT THCKNESS SKIN GRAFT RIGHT ANKLE, VAC CHANGE RIGHT THIGH SKIN GRAFT SPLIT THICKNESS APPLICATION OF WOUND VAC  The risks benefits and alternatives were discussed with the patient including  but not limited to the risks of nonoperative treatment, versus surgical intervention including infection, bleeding, nerve injury,  blood clots, cardiopulmonary complications, morbidity, mortality, among others, and they were willing to proceed.   Cheral AlmasXu, Naiping Michael, MD   10/07/2014 4:58 PM

## 2014-10-08 ENCOUNTER — Encounter (HOSPITAL_COMMUNITY)
Admission: RE | Disposition: A | Discharge status: Home / Self Care | Payer: Self-pay | Source: Ambulatory Visit | Attending: Orthopaedic Surgery

## 2014-10-08 ENCOUNTER — Encounter (HOSPITAL_COMMUNITY): Payer: Self-pay | Admitting: *Deleted

## 2014-10-08 ENCOUNTER — Ambulatory Visit (HOSPITAL_COMMUNITY): Payer: BC Managed Care – PPO | Admitting: Certified Registered Nurse Anesthetist

## 2014-10-08 ENCOUNTER — Ambulatory Visit (HOSPITAL_COMMUNITY)
Admission: RE | Admit: 2014-10-08 | Discharge: 2014-10-08 | Disposition: A | Discharge status: Home / Self Care | Payer: BC Managed Care – PPO | Source: Ambulatory Visit | Attending: Orthopaedic Surgery | Admitting: Orthopaedic Surgery

## 2014-10-08 ENCOUNTER — Telehealth: Payer: Self-pay | Admitting: Internal Medicine

## 2014-10-08 DIAGNOSIS — S91001A Unspecified open wound, right ankle, initial encounter: Secondary | ICD-10-CM | POA: Insufficient documentation

## 2014-10-08 DIAGNOSIS — S71101A Unspecified open wound, right thigh, initial encounter: Secondary | ICD-10-CM | POA: Insufficient documentation

## 2014-10-08 DIAGNOSIS — L03115 Cellulitis of right lower limb: Secondary | ICD-10-CM

## 2014-10-08 DIAGNOSIS — F988 Other specified behavioral and emotional disorders with onset usually occurring in childhood and adolescence: Secondary | ICD-10-CM | POA: Diagnosis not present

## 2014-10-08 DIAGNOSIS — Z885 Allergy status to narcotic agent status: Secondary | ICD-10-CM | POA: Insufficient documentation

## 2014-10-08 HISTORY — PX: SKIN SPLIT GRAFT: SHX444

## 2014-10-08 HISTORY — PX: I & D EXTREMITY: SHX5045

## 2014-10-08 HISTORY — PX: APPLICATION OF WOUND VAC: SHX5189

## 2014-10-08 LAB — CBC
HEMATOCRIT: 37.4 % — AB (ref 39.0–52.0)
HEMOGLOBIN: 12.9 g/dL — AB (ref 13.0–17.0)
MCH: 28.9 pg (ref 26.0–34.0)
MCHC: 34.5 g/dL (ref 30.0–36.0)
MCV: 83.9 fL (ref 78.0–100.0)
Platelets: 176 10*3/uL (ref 150–400)
RBC: 4.46 MIL/uL (ref 4.22–5.81)
RDW: 11.9 % (ref 11.5–15.5)
WBC: 5.6 10*3/uL (ref 4.0–10.5)

## 2014-10-08 SURGERY — IRRIGATION AND DEBRIDEMENT EXTREMITY
Anesthesia: General | Site: Leg Upper | Laterality: Right

## 2014-10-08 MED ORDER — CEFAZOLIN SODIUM-DEXTROSE 2-3 GM-% IV SOLR
INTRAVENOUS | Status: DC | PRN
  Administered 2014-10-08: 2 g via INTRAVENOUS

## 2014-10-08 MED ORDER — HYDROMORPHONE HCL 1 MG/ML IJ SOLN
INTRAMUSCULAR | Status: AC
  Administered 2014-10-08: 0.5 mg via INTRAVENOUS
  Filled 2014-10-08: qty 1

## 2014-10-08 MED ORDER — PHENYLEPHRINE 40 MCG/ML (10ML) SYRINGE FOR IV PUSH (FOR BLOOD PRESSURE SUPPORT)
PREFILLED_SYRINGE | INTRAVENOUS | Status: AC
  Filled 2014-10-08: qty 10

## 2014-10-08 MED ORDER — DEXAMETHASONE SODIUM PHOSPHATE 4 MG/ML IJ SOLN
INTRAMUSCULAR | Status: AC
  Filled 2014-10-08: qty 1

## 2014-10-08 MED ORDER — SODIUM CHLORIDE 0.9 % IV SOLN
1.0000 g | Freq: Once | INTRAVENOUS | Status: AC
  Administered 2014-10-08: 1 g via INTRAVENOUS
  Filled 2014-10-08 (×2): qty 1

## 2014-10-08 MED ORDER — OXYCODONE HCL 5 MG PO TABS
ORAL_TABLET | ORAL | Status: AC
  Filled 2014-10-08: qty 1

## 2014-10-08 MED ORDER — FENTANYL CITRATE 0.05 MG/ML IJ SOLN
INTRAMUSCULAR | Status: DC | PRN
  Administered 2014-10-08: 25 ug via INTRAVENOUS
  Administered 2014-10-08: 50 ug via INTRAVENOUS

## 2014-10-08 MED ORDER — LACTATED RINGERS IV SOLN: INTRAVENOUS | Status: DC

## 2014-10-08 MED ORDER — LIDOCAINE HCL (CARDIAC) 20 MG/ML IV SOLN
INTRAVENOUS | Status: DC | PRN
  Administered 2014-10-08: 100 mg via INTRAVENOUS

## 2014-10-08 MED ORDER — EPINEPHRINE 1 MG/ML IJ SOLN
INTRAMUSCULAR | Status: DC | PRN
  Administered 2014-10-08: 30 mL

## 2014-10-08 MED ORDER — SULFAMETHOXAZOLE-TRIMETHOPRIM 800-160 MG PO TABS: 1.0000 | ORAL_TABLET | Freq: Two times a day (BID) | ORAL | Status: DC

## 2014-10-08 MED ORDER — OXYCODONE HCL 5 MG PO TABS
5.0000 mg | ORAL_TABLET | Freq: Once | ORAL | Status: AC | PRN
  Administered 2014-10-08: 5 mg via ORAL

## 2014-10-08 MED ORDER — EPINEPHRINE HCL (NASAL) 0.1 % NA SOLN
NASAL | Status: AC
  Filled 2014-10-08: qty 30

## 2014-10-08 MED ORDER — HYDROCODONE-ACETAMINOPHEN 5-325 MG PO TABS: 1.0000 | ORAL_TABLET | Freq: Four times a day (QID) | ORAL | Status: DC | PRN

## 2014-10-08 MED ORDER — PHENYLEPHRINE HCL 10 MG/ML IJ SOLN
INTRAMUSCULAR | Status: DC | PRN
  Administered 2014-10-08: 40 ug via INTRAVENOUS

## 2014-10-08 MED ORDER — FENTANYL CITRATE 0.05 MG/ML IJ SOLN
INTRAMUSCULAR | Status: AC
  Administered 2014-10-08: 25 ug via INTRAVENOUS
  Filled 2014-10-08: qty 2

## 2014-10-08 MED ORDER — MINERAL OIL LIGHT 100 % EX OIL
TOPICAL_OIL | CUTANEOUS | Status: DC | PRN
  Administered 2014-10-08: 1 via TOPICAL

## 2014-10-08 MED ORDER — ONDANSETRON HCL 4 MG/2ML IJ SOLN
INTRAMUSCULAR | Status: DC | PRN
  Administered 2014-10-08: 4 mg via INTRAVENOUS

## 2014-10-08 MED ORDER — FENTANYL CITRATE 0.05 MG/ML IJ SOLN
25.0000 ug | INTRAMUSCULAR | Status: DC | PRN
  Administered 2014-10-08 (×4): 25 ug via INTRAVENOUS

## 2014-10-08 MED ORDER — PROPOFOL 10 MG/ML IV BOLUS
INTRAVENOUS | Status: DC | PRN
  Administered 2014-10-08: 250 mg via INTRAVENOUS

## 2014-10-08 MED ORDER — PROPOFOL 10 MG/ML IV BOLUS
INTRAVENOUS | Status: AC
  Filled 2014-10-08: qty 20

## 2014-10-08 MED ORDER — SODIUM CHLORIDE 0.9 % IR SOLN
Status: DC | PRN
  Administered 2014-10-08: 3000 mL

## 2014-10-08 MED ORDER — ONDANSETRON HCL 4 MG/2ML IJ SOLN: 4.0000 mg | Freq: Four times a day (QID) | INTRAMUSCULAR | Status: DC | PRN

## 2014-10-08 MED ORDER — HYDROMORPHONE HCL 1 MG/ML IJ SOLN
0.2500 mg | INTRAMUSCULAR | Status: DC | PRN
  Administered 2014-10-08 (×4): 0.5 mg via INTRAVENOUS

## 2014-10-08 MED ORDER — FENTANYL CITRATE 0.05 MG/ML IJ SOLN
INTRAMUSCULAR | Status: AC
  Filled 2014-10-08: qty 5

## 2014-10-08 MED ORDER — MINERAL OIL LIGHT 100 % EX OIL
TOPICAL_OIL | CUTANEOUS | Status: AC
  Filled 2014-10-08: qty 25

## 2014-10-08 MED ORDER — OXYCODONE HCL 5 MG PO TABS: 5.0000 mg | ORAL_TABLET | ORAL | Status: DC | PRN

## 2014-10-08 MED ORDER — ONDANSETRON HCL 4 MG PO TABS: 4.0000 mg | ORAL_TABLET | Freq: Three times a day (TID) | ORAL | Status: DC | PRN

## 2014-10-08 MED ORDER — LACTATED RINGERS IV SOLN
INTRAVENOUS | Status: DC | PRN
  Administered 2014-10-08: 10:00:00 via INTRAVENOUS

## 2014-10-08 MED ORDER — OXYCODONE HCL 5 MG/5ML PO SOLN: 5.0000 mg | Freq: Once | ORAL | Status: AC | PRN

## 2014-10-08 MED FILL — Epinephrine HCl Inj 1 MG/ML: INTRAMUSCULAR | Qty: 30 | Status: AC

## 2014-10-08 SURGICAL SUPPLY — 76 items
BANDAGE ELASTIC 3 VELCRO ST LF (GAUZE/BANDAGES/DRESSINGS) IMPLANT
BANDAGE ELASTIC 6 VELCRO ST LF (GAUZE/BANDAGES/DRESSINGS) IMPLANT
BLADE SURG 10 STRL SS (BLADE) ×3 IMPLANT
BLADE SURG ROTATE 9660 (MISCELLANEOUS) ×3 IMPLANT
BNDG COHESIVE 1X5 TAN STRL LF (GAUZE/BANDAGES/DRESSINGS) IMPLANT
BNDG COHESIVE 4X5 TAN STRL (GAUZE/BANDAGES/DRESSINGS) ×3 IMPLANT
BNDG COHESIVE 6X5 TAN STRL LF (GAUZE/BANDAGES/DRESSINGS) ×6 IMPLANT
BNDG CONFORM 3 STRL LF (GAUZE/BANDAGES/DRESSINGS) IMPLANT
BNDG GAUZE ELAST 4 BULKY (GAUZE/BANDAGES/DRESSINGS) IMPLANT
BNDG GAUZE STRTCH 6 (GAUZE/BANDAGES/DRESSINGS) IMPLANT
CANISTER SUCTION 2500CC (MISCELLANEOUS) IMPLANT
CHLORAPREP W/TINT 26ML (MISCELLANEOUS) ×6 IMPLANT
CONNECTOR Y TYPE (MISCELLANEOUS) ×3 IMPLANT
CORDS BIPOLAR (ELECTRODE) IMPLANT
COVER SURGICAL LIGHT HANDLE (MISCELLANEOUS) ×3 IMPLANT
CUFF TOURNIQUET SINGLE 24IN (TOURNIQUET CUFF) IMPLANT
CUFF TOURNIQUET SINGLE 34IN LL (TOURNIQUET CUFF) IMPLANT
CUFF TOURNIQUET SINGLE 44IN (TOURNIQUET CUFF) IMPLANT
DERMACARRIERS GRAFT 1 TO 1.5 (DISPOSABLE) ×3
DRAPE EXTREMITY BILATERAL (DRAPE) IMPLANT
DRAPE IMP U-DRAPE 54X76 (DRAPES) IMPLANT
DRAPE INCISE IOBAN 66X45 STRL (DRAPES) IMPLANT
DRAPE SURG 17X23 STRL (DRAPES) ×3 IMPLANT
DRAPE U-SHAPE 47X51 STRL (DRAPES) ×3 IMPLANT
DRSG ADAPTIC 3X8 NADH LF (GAUZE/BANDAGES/DRESSINGS) ×3 IMPLANT
DRSG PAD ABDOMINAL 8X10 ST (GAUZE/BANDAGES/DRESSINGS) ×3 IMPLANT
DRSG TELFA 3X8 NADH (GAUZE/BANDAGES/DRESSINGS) ×3 IMPLANT
DRSG VAC ATS MED SENSATRAC (GAUZE/BANDAGES/DRESSINGS) ×3 IMPLANT
DURAPREP 26ML APPLICATOR (WOUND CARE) ×3 IMPLANT
ELECT CAUTERY BLADE 6.4 (BLADE) ×3 IMPLANT
ELECT REM PT RETURN 9FT ADLT (ELECTROSURGICAL)
ELECTRODE REM PT RTRN 9FT ADLT (ELECTROSURGICAL) IMPLANT
FACESHIELD WRAPAROUND (MASK) IMPLANT
GAUZE SPONGE 4X4 12PLY STRL (GAUZE/BANDAGES/DRESSINGS) ×3 IMPLANT
GAUZE XEROFORM 1X8 LF (GAUZE/BANDAGES/DRESSINGS) ×3 IMPLANT
GAUZE XEROFORM 5X9 LF (GAUZE/BANDAGES/DRESSINGS) ×3 IMPLANT
GLOVE BIO SURGEON STRL SZ7 (GLOVE) ×9 IMPLANT
GLOVE ORTHO TXT STRL SZ7.5 (GLOVE) ×3 IMPLANT
GLOVE SURG SYN 7.5  E (GLOVE) ×2
GLOVE SURG SYN 7.5 E (GLOVE) ×4 IMPLANT
GOWN STRL REIN XL XLG (GOWN DISPOSABLE) ×3 IMPLANT
GOWN STRL REUS W/ TWL LRG LVL3 (GOWN DISPOSABLE) ×2 IMPLANT
GOWN STRL REUS W/TWL LRG LVL3 (GOWN DISPOSABLE) ×1
GRAFT DERMACARRIERS 1 TO 1.5 (DISPOSABLE) ×2 IMPLANT
HANDPIECE INTERPULSE COAX TIP (DISPOSABLE)
KIT BASIN OR (CUSTOM PROCEDURE TRAY) ×3 IMPLANT
KIT ROOM TURNOVER OR (KITS) ×3 IMPLANT
MANIFOLD NEPTUNE II (INSTRUMENTS) IMPLANT
NS IRRIG 1000ML POUR BTL (IV SOLUTION) ×3 IMPLANT
PACK ORTHO EXTREMITY (CUSTOM PROCEDURE TRAY) ×3 IMPLANT
PAD ABD 8X10 STRL (GAUZE/BANDAGES/DRESSINGS) IMPLANT
PAD ARMBOARD 7.5X6 YLW CONV (MISCELLANEOUS) ×6 IMPLANT
PAD NEG PRESSURE SENSATRAC (MISCELLANEOUS) ×3 IMPLANT
PADDING CAST ABS 4INX4YD NS (CAST SUPPLIES)
PADDING CAST ABS COTTON 4X4 ST (CAST SUPPLIES) IMPLANT
PADDING CAST COTTON 6X4 STRL (CAST SUPPLIES) IMPLANT
SET HNDPC FAN SPRY TIP SCT (DISPOSABLE) IMPLANT
SPONGE LAP 18X18 X RAY DECT (DISPOSABLE) IMPLANT
STAPLER VISISTAT 35W (STAPLE) IMPLANT
STOCKINETTE IMPERVIOUS 9X36 MD (GAUZE/BANDAGES/DRESSINGS) IMPLANT
SUT ETHILON 2 0 FS 18 (SUTURE) IMPLANT
SUT ETHILON 2 0 PSLX (SUTURE) IMPLANT
SUT ETHILON 3 0 PS 1 (SUTURE) IMPLANT
SUT MNCRL AB 4-0 PS2 18 (SUTURE) ×6 IMPLANT
SUT VIC AB 2-0 CT1 36 (SUTURE) IMPLANT
SUT VIC AB 2-0 FS1 27 (SUTURE) IMPLANT
SYR CONTROL 10ML LL (SYRINGE) IMPLANT
TOWEL OR 17X24 6PK STRL BLUE (TOWEL DISPOSABLE) ×3 IMPLANT
TOWEL OR 17X26 10 PK STRL BLUE (TOWEL DISPOSABLE) ×3 IMPLANT
TUBE ANAEROBIC SPECIMEN COL (MISCELLANEOUS) IMPLANT
TUBE CONNECTING 12X1/4 (SUCTIONS) ×3 IMPLANT
TUBE FEEDING 5FR 15 INCH (TUBING) IMPLANT
TUBING CYSTO DISP (UROLOGICAL SUPPLIES) ×3 IMPLANT
UNDERPAD 30X30 INCONTINENT (UNDERPADS AND DIAPERS) ×6 IMPLANT
WATER STERILE IRR 1000ML POUR (IV SOLUTION) IMPLANT
YANKAUER SUCT BULB TIP NO VENT (SUCTIONS) ×3 IMPLANT

## 2014-10-08 NOTE — Transfer of Care (Signed)
Immediate Anesthesia Transfer of Care Note  Patient: Tyler BerkshireDaniel Brock  Procedure(s) Performed: Procedure(s): IRRIGATION AND DEBRIDEMENT AND SPLIT THCKNESS SKIN GRAFT RIGHT ANKLE, VAC CHANGE RIGHT THIGH (Right) SKIN GRAFT SPLIT THICKNESS (Right) APPLICATION OF WOUND VAC (Right)  Patient Location: PACU  Anesthesia Type:General  Level of Consciousness: awake, alert , oriented and patient cooperative  Airway & Oxygen Therapy: Patient Spontanous Breathing and Patient connected to nasal cannula oxygen  Post-op Assessment: Report given to PACU RN, Post -op Vital signs reviewed and stable and Patient moving all extremities X 4  Post vital signs: Reviewed and stable  Complications: No apparent anesthesia complications

## 2014-10-08 NOTE — Anesthesia Postprocedure Evaluation (Signed)
Anesthesia Post Note  Patient: Tyler Brock  Procedure(s) Performed: Procedure(s) (LRB): IRRIGATION AND DEBRIDEMENT AND SPLIT THCKNESS SKIN GRAFT RIGHT ANKLE, VAC CHANGE RIGHT THIGH (Right) SKIN GRAFT SPLIT THICKNESS (Right) APPLICATION OF WOUND VAC (Right)  Anesthesia type: General  Patient location: PACU  Post pain: Pain level controlled and Adequate analgesia  Post assessment: Post-op Vital signs reviewed, Patient's Cardiovascular Status Stable, Respiratory Function Stable, Patent Airway and Pain level controlled  Last Vitals:  Filed Vitals:   10/08/14 1221  BP: 124/52  Pulse: 86  Temp:   Resp: 20    Post vital signs: Reviewed and stable  Level of consciousness: awake, alert  and oriented  Complications: No apparent anesthesia complications

## 2014-10-08 NOTE — Anesthesia Procedure Notes (Signed)
Procedure Name: LMA Insertion Date/Time: 10/08/2014 9:49 AM Performed by: Adonis HousekeeperNGELL, Naomia Lenderman M Pre-anesthesia Checklist: Patient identified, Emergency Drugs available, Suction available and Patient being monitored Patient Re-evaluated:Patient Re-evaluated prior to inductionOxygen Delivery Method: Circle system utilized Preoxygenation: Pre-oxygenation with 100% oxygen Intubation Type: IV induction Ventilation: Mask ventilation without difficulty LMA: LMA inserted LMA Size: 5.0 Number of attempts: 1 Placement Confirmation: ETT inserted through vocal cords under direct vision,  positive ETCO2 and breath sounds checked- equal and bilateral Tube secured with: Tape Dental Injury: Teeth and Oropharynx as per pre-operative assessment

## 2014-10-08 NOTE — Discharge Instructions (Signed)
1. Do not change right ankle wound vac 2. Continue to change right thigh wound vac MWF 3. Change dressing to right thigh skin donor site daily with xeroform, gauze, ABD, coban
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
## 2014-10-08 NOTE — Anesthesia Preprocedure Evaluation (Signed)
Anesthesia Evaluation  Patient identified by MRN, date of birth, ID band Patient awake    Reviewed: Allergy & Precautions, H&P , NPO status , Patient's Chart, lab work & pertinent test results  Airway Mallampati: II  Neck ROM: full    Dental   Pulmonary asthma ,          Cardiovascular negative cardio ROS      Neuro/Psych ADD   GI/Hepatic   Endo/Other    Renal/GU      Musculoskeletal   Abdominal   Peds  Hematology   Anesthesia Other Findings   Reproductive/Obstetrics                           Anesthesia Physical Anesthesia Plan  ASA: II  Anesthesia Plan: General   Post-op Pain Management:    Induction: Intravenous  Airway Management Planned: LMA  Additional Equipment:   Intra-op Plan:   Post-operative Plan:   Informed Consent: I have reviewed the patients History and Physical, chart, labs and discussed the procedure including the risks, benefits and alternatives for the proposed anesthesia with the patient or authorized representative who has indicated his/her understanding and acceptance.     Plan Discussed with: CRNA, Anesthesiologist and Surgeon  Anesthesia Plan Comments:         Anesthesia Quick Evaluation  

## 2014-10-08 NOTE — Op Note (Addendum)
Date of surgery: 10/08/2014  Preoperative diagnosis:  1. Right ankle wound measuring 7 x 6 cm 2. Right thigh wound measuring  4 x 3 cm  Postoperative diagnosis: Same  Procedure: 1. Surgical preparation of right ankle wound recipient site 2. Debridement of muscle and subcutaneous tissue and skin of right thigh 3. Application of split thickness skin graft to right ankle wound 4. Application of negative wound therapy greater than 50 cm 5. Wound healing of the right thigh by secondary intention, complex wound  Surgeon: Glee ArvinMichael Xu, MD  Anesthesia: Gen.  Estimated blood loss: Minimal  Complications: none  Indications for procedure: Tyler BoomDaniel returns today for the planned above-mentioned procedures.  The risks, benefits, and alternatives to surgery were discussed with the patient and his mother and they agreed to proceed  Description of procedure: The patient was identified in the preoperative holding area. The operative sites were marked by the surgeon confirmed with the patient. He was brought back to the operating room. He was placed supine on the table. General anesthesia was induced. Right lower extremity was prepped and draped in standard sterile fashion. Timeout was performed. Preoperative antibiotics given. We first began with debridement of the right thigh wound. Sharp excisional debridement of the subcutaneous tissue, muscle, skin was performed with a knife. There was healthy red beefy granulating tissue.  Normal saline was irrigated through the wound and a wound VAC was placed on the right thigh.  We then turned our attention to the right ankle wound. Surgical preparation of the right ankle wound recipient site was carried out using a knife. There was good bleeding red beefy tissue. We then took a 14/1000 of an inch split thickness skin graft the right thigh.  This was then meshed 1.5:1.  The skin graft was placed down onto the wound. This was secured down with a running 4-0 Monocryl  suture. Adaptic was placed on top of the skin graft. A wound VAC was then placed on top of the Adaptic. The wound VAC was turned to -75 mmHg.  A sterile dressing was applied to the right thigh skin donor site. The patient tolerated the procedure well. He was extubated and transferred to the PACU in stable condition.  Postoperative plan. Patient will be weight-bear as tolerated to right lower extremity. He will continue Monday Wednesday VAC changes to the right thigh.. He will require daily Xeroform dressing changes to the  Skin donor site. The wound VAC to the skin graft will be taken down in a week in clinic. We will also discontinue IV antibiotics at this point and continue him on by mouth Septra.  [name]. Michael Xu, MD [facility] [phone] 10:58 AM

## 2014-10-09 ENCOUNTER — Encounter: Payer: Self-pay | Admitting: Family Medicine

## 2014-10-09 ENCOUNTER — Encounter (HOSPITAL_COMMUNITY): Payer: Self-pay | Admitting: Orthopaedic Surgery

## 2014-10-22 ENCOUNTER — Ambulatory Visit (INDEPENDENT_AMBULATORY_CARE_PROVIDER_SITE_OTHER): Payer: BC Managed Care – PPO | Admitting: Infectious Diseases

## 2014-10-22 ENCOUNTER — Encounter: Payer: Self-pay | Admitting: Infectious Diseases

## 2014-10-22 VITALS — BP 118/78 | HR 76 | Temp 98.2°F | Wt 197.0 lb

## 2014-10-22 DIAGNOSIS — M726 Necrotizing fasciitis: Secondary | ICD-10-CM | POA: Diagnosis not present
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
## 2014-10-22 NOTE — Assessment & Plan Note (Signed)
He has had an unusual course in that he had a pan-sensitive E coli that became resistant while he was on anbx that should have been effective. He denies missed doses of his IV anbx while at home. He is doing well on PO anbx now. He will complete these on ~12-4 and we will see him back after this. Hopefully his wound will continue to heal.

## 2014-11-11 ENCOUNTER — Ambulatory Visit: Payer: BC Managed Care – PPO | Admitting: Internal Medicine

## 2014-11-12 ENCOUNTER — Ambulatory Visit (INDEPENDENT_AMBULATORY_CARE_PROVIDER_SITE_OTHER): Payer: BC Managed Care – PPO | Admitting: Infectious Disease

## 2014-11-12 ENCOUNTER — Encounter: Payer: Self-pay | Admitting: Infectious Disease

## 2014-11-12 VITALS — BP 115/80 | HR 76 | Temp 98.3°F | Wt 198.0 lb

## 2014-11-12 DIAGNOSIS — Z1612 Extended spectrum beta lactamase (ESBL) resistance: Secondary | ICD-10-CM

## 2014-11-12 DIAGNOSIS — A499 Bacterial infection, unspecified: Secondary | ICD-10-CM | POA: Diagnosis not present

## 2014-11-12 DIAGNOSIS — M726 Necrotizing fasciitis: Secondary | ICD-10-CM | POA: Diagnosis not present

## 2014-11-12 DIAGNOSIS — S91001D Unspecified open wound, right ankle, subsequent encounter: Secondary | ICD-10-CM

## 2014-11-12 DIAGNOSIS — L0390 Cellulitis, unspecified: Secondary | ICD-10-CM
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
## 2014-11-19 ENCOUNTER — Ambulatory Visit (INDEPENDENT_AMBULATORY_CARE_PROVIDER_SITE_OTHER): Payer: BC Managed Care – PPO | Admitting: Family Medicine

## 2014-11-19 ENCOUNTER — Encounter: Payer: Self-pay | Admitting: Family Medicine

## 2014-11-19 DIAGNOSIS — J029 Acute pharyngitis, unspecified: Secondary | ICD-10-CM

## 2014-11-19 LAB — POCT RAPID STREP A (OFFICE): Rapid Strep A Screen: NEGATIVE

## 2014-11-19 MED ORDER — BENZONATATE 200 MG PO CAPS: 200.0000 mg | ORAL_CAPSULE | Freq: Three times a day (TID) | ORAL | Status: DC | PRN

## 2014-11-19 NOTE — Patient Instructions (Signed)
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
## 2014-11-19 NOTE — Progress Notes (Signed)
Pre visit review using our clinic review tool, if applicable. No additional management support is needed unless otherwise documented below in the visit note. 

## 2015-10-27 ENCOUNTER — Ambulatory Visit (INDEPENDENT_AMBULATORY_CARE_PROVIDER_SITE_OTHER): Payer: BLUE CROSS/BLUE SHIELD | Admitting: Family Medicine

## 2015-10-27 ENCOUNTER — Encounter: Payer: Self-pay | Admitting: Family Medicine

## 2015-10-27 VITALS — BP 108/68 | HR 95 | Temp 97.9°F | Ht 70.0 in | Wt 186.0 lb

## 2015-10-27 DIAGNOSIS — J069 Acute upper respiratory infection, unspecified: Secondary | ICD-10-CM

## 2015-10-27 NOTE — Progress Notes (Signed)
Pre visit review using our clinic review tool, if applicable. No additional management support is needed unless otherwise documented below in the visit note. 

## 2015-10-27 NOTE — Patient Instructions (Signed)

## 2015-10-27 NOTE — Progress Notes (Signed)
HPI:  URI: -started: 3 days ago -symptoms:nasal congestion, sore throat, cough, sinus pressure -denies:fever, SOB, NVD, tooth pain -has tried: Nyquil -sick contacts/travel/risks: denies flu exposure, tick exposure or or Ebola risks  ROS: See pertinent positives and negatives per HPI.  Past Medical History  Diagnosis Date  . ADD (attention deficit disorder)   . Gilbert's disease   . Jaundice of newborn   . Sepsis (HCC) 2015    From right ankle wound [place].  . Asthma     exercise induced - only one time.   . Necrotizing fasciitis (HCC)   . ESBL (extended spectrum beta-lactamase) producing bacteria infection     Past Surgical History  Procedure Laterality Date  . I&d extremity Right 09/04/2014    Procedure: IRRIGATION AND DEBRIDEMENT RIGHT LEG;  Surgeon: Cheral Almas, MD;  Location: [facility] OR;  Service: Orthopedics;  Laterality: Right;  . I&d extremity Right 09/05/2014    Procedure: IRRIGATION AND DEBRIDEMENT EXTREMITY, Closure of right inner thigh wound;  Surgeon: Cheral Almas, MD;  [facility];  Service: Orthopedics;  Laterality: Right;  . Application of wound vac Right 09/05/2014    Procedure: APPLICATION OF WOUND VAC;  Surgeon: Cheral Almas, MD;  [facility] OR;  Service: Orthopedics;  Laterality: Right;  . I&d extremity Right 09/08/2014    Procedure: IRRIGATION AND DEBRIDEMENT RIGHT LEG WITH APPLICATION OF WOUND VAC;  Surgeon: Cheral Almas, MD;  [facility]: [facility] OR;  Service: Orthopedics;  Laterality: Right;  . I&d extremity Right 09/17/2014    Procedure: IRRIGATION AND DEBRIDEMENT RIGHT ANKLE, INTEGRA PLACEMENT, WOUND VAC;  Surgeon: Cheral Almas, MD;  [facility] OR;  Service: Orthopedics;  Laterality: Right;  . Application of wound vac Right 09/17/2014    Procedure: APPLICATION OF WOUND VAC;  Surgeon: Cheral Almas, MD;  [facility];  Service: Orthopedics;  Laterality: Right;  . I&d extremity Right 09/24/2014   Procedure: IRRIGATION AND DEBRIDEMENT of RIGHT LOWER EXTREMITY;  Surgeon: Cheral Almas, MD;  [facility] OR;  Service: Orthopedics;  Laterality: Right;  . Application of wound vac Right 09/24/2014    Procedure: APPLICATION OF WOUND VAC to Right Ankle and Right Thigh;  Surgeon: Cheral Almas, MD;  [facility];  Service: Orthopedics;  Laterality: Right;  . Incision and drainage of wound Right 09/24/2014    Procedure: IRRIGATION AND DEBRIDEMENT of RIGHT THIGH WOUND;  Surgeon: Cheral Almas, MD;  [facility];  Service: Orthopedics;  Laterality: Right;  . I&d extremity Right 10/08/2014    Procedure: IRRIGATION AND DEBRIDEMENT RIGHT ANKLE;  Surgeon: Cheral Almas, MD;  [facility] OR;  Service: Orthopedics;  Laterality: Right;  . Skin split graft Right 10/08/2014    Procedure: SKIN GRAFT SPLIT THICKNESS;  Surgeon: Cheral Almas, MD;  Location: [facility] OR;  Service: Orthopedics;  Laterality: Right;  Graft from right  upper outer thigh to right lateral ankle  . Application of wound vac Right 10/08/2014    Procedure: APPLICATION OF WOUND VAC;  Surgeon: Cheral Almas, MD;  [facility] OR;  Service: Orthopedics;  Laterality: Right;  Applied to right upper inner thigh and right lateral ankle    Family History  Problem Relation Age of Onset  . Alcohol abuse Father   . Cancer Father     lung  . Arthritis Other   . Cancer Other     breast, prostate  . Hyperlipidemia Other   . Hypertension Other  Social History   Social History  . Marital Status: Single    Spouse Name: N/A  . Number of Children: N/A  . Years of Education: N/A   Social History Main Topics  . Smoking status: Never Smoker   . Smokeless tobacco: Never Used  . Alcohol Use: Yes     Comment: socially  . Drug Use: No  . Sexual Activity: Not Asked   Other Topics Concern  . None   Social History Narrative   Occupation:  ArchivistCollege Student   Single    Never Smoked   Alcohol use- no   Drug use-  no           Current outpatient prescriptions:  .  triamcinolone cream (KENALOG) 0.1 %, Apply 1 application topically 2 (two) times daily. For flare ups., Disp: , Rfl:   EXAM:  Filed Vitals:   10/27/15 1553  BP: 108/68  Pulse: 95  Temp: 97.9 F (36.6 C)    Body mass index is 26.69 kg/(m^2).  GENERAL: vitals reviewed and listed above, alert, oriented, appears well hydrated and in no acute distress  HEENT: atraumatic, conjunttiva clear, no obvious abnormalities on inspection of external nose and ears, normal appearance of ear canals and TMs, clear nasal congestion, mild post oropharyngeal erythema with PND, no tonsillar edema or exudate, no sinus TTP  NECK: no obvious masses on inspection  LUNGS: clear to auscultation bilaterally, no wheezes, rales or rhonchi, good air movement  CV: HRRR, no peripheral edema  MS: moves all extremities without noticeable abnormality  PSYCH: pleasant and cooperative, no obvious depression or anxiety  ASSESSMENT AND PLAN:  Discussed the following assessment and plan:  Acute upper respiratory infection  -given HPI and exam findings today, a serious infection or illness is unlikely. We discussed potential etiologies, with VURI being most likely, and advised supportive care and monitoring. We discussed treatment side effects, likely course, antibiotic misuse, transmission, and signs of developing a serious illness. -of course, we advised to return or notify a doctor immediately if symptoms worsen or persist or new concerns arise.    Patient Instructions  INSTRUCTIONS FOR UPPER RESPIRATORY INFECTION:  -plenty of rest and fluids  -nasal saline wash 2-3 times daily (use prepackaged nasal saline or bottled/distilled water if making your own)   -can use AFRIN nasal spray for drainage and nasal congestion - but do NOT use longer then 3-4 days  -can use tylenol (in no history of liver disease) or ibuprofen (if no history of kidney disease,  bowel bleeding or significant heart disease) as directed for aches and sorethroat  -in the winter time, using a humidifier at night is helpful (please follow cleaning instructions)  -if you are taking a cough medication - use only as directed, may also try a teaspoon of honey to coat the throat and throat lozenges. If given a cough medication with codeine or hydrocodone or other narcotic please be advised that this contains a strong and  potentially addicting medication. Please follow instructions carefully, take as little as possible and only use AS NEEDED for severe cough. Discuss potential side effects with your pharmacy. Please do not drive or operate machinery while taking these types of medications. Please do not take other sedating medications, drugs or alcohol while taking this medication without discussing with your doctor.  -for sore throat, salt water gargles can help  -follow up if you have fevers, facial pain, tooth pain, difficulty breathing or are worsening or symptoms persist longer then expected  Upper Respiratory Infection, Adult An upper respiratory infection (URI) is also known as the common cold. It is often caused by a type of germ (virus). Colds are easily spread (contagious). You can pass it to others by kissing, coughing, sneezing, or drinking out of the same glass. Usually, you get better in 1 to 3  weeks.  However, the cough can last for even longer. HOME CARE   Only take medicine as told by your doctor. Follow instructions provided above.  Drink enough water and fluids to keep your pee (urine) clear or pale yellow.  Get plenty of rest.  Return to work when your temperature is < 100 for 24 hours or as told by your doctor. You may use a face mask and wash your hands to stop your cold from spreading. GET HELP RIGHT AWAY IF:   After the first few days, you feel you are getting worse.  You have questions about your medicine.  You have chills, shortness of breath, or  red spit (mucus).  You have pain in the face for more then 1-2 days, especially when you bend forward.  You have a fever, puffy (swollen) neck, pain when you swallow, or white spots in the back of your throat.  You have a bad headache, ear pain, sinus pain, or chest pain.  You have a high-pitched whistling sound when you breathe in and out (wheezing).  You cough up blood.  You have sore muscles or a stiff neck. MAKE SURE YOU:   Understand these instructions.  Will watch your condition.  Will get help right away if you are not doing well or get worse. Document Released: 05/09/2008 Document Revised: 02/13/2012 Document Reviewed: 02/26/2014 [facility] Patient Information 2015 [place], [place]. This information is not intended to replace advice given to you by your health care provider. Make sure you discuss any questions you have with your health care provider.      Kriste Basque R.
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
## 2017-08-24 ENCOUNTER — Encounter: Payer: Self-pay | Admitting: Family Medicine

## 2017-12-22 ENCOUNTER — Telehealth: Payer: Self-pay | Admitting: Family Medicine

## 2017-12-22 NOTE — Telephone Encounter (Signed)
Copied from CRM [id number]. Topic: General - Other >> Dec 22, 2017 11:16 AM Viviann SpareWhite, Selina wrote: Reason for CRM: Patient call to schedule an appt with Dr. Caryl NeverBurchette, he has not been seen by Dr. Caryl NeverBurchette since 2015. He was seen by Dr. Selena BattenKim in 2016 and then he moved out of town. Patient have moved back to the area and would like to est care with Dr. Caryl NeverBurchette again. Patient would like a call back to let him know if Dr. Caryl NeverBurchette is willing to or will not be able to take him back as a patient, [phone]

## 2017-12-22 NOTE — Telephone Encounter (Signed)
ok 

## 2017-12-25 NOTE — Telephone Encounter (Signed)
Appointment made

## 2017-12-26 ENCOUNTER — Ambulatory Visit: Payer: BLUE CROSS/BLUE SHIELD | Admitting: Family Medicine

## 2017-12-26 ENCOUNTER — Encounter: Payer: Self-pay | Admitting: Family Medicine

## 2017-12-26 VITALS — BP 110/70 | HR 82 | Temp 98.2°F | Ht 71.0 in | Wt 183.4 lb

## 2017-12-26 DIAGNOSIS — F988 Other specified behavioral and emotional disorders with onset usually occurring in childhood and adolescence: Secondary | ICD-10-CM

## 2017-12-26 DIAGNOSIS — F410 Panic disorder [episodic paroxysmal anxiety] without agoraphobia: Secondary | ICD-10-CM | POA: Diagnosis not present

## 2017-12-26 MED ORDER — TRIAMCINOLONE ACETONIDE 0.1 % EX CREA: 1.0000 "application " | TOPICAL_CREAM | Freq: Two times a day (BID) | CUTANEOUS | 2 refills | Status: AC

## 2017-12-26 MED ORDER — LISDEXAMFETAMINE DIMESYLATE 40 MG PO CAPS: 40.0000 mg | ORAL_CAPSULE | ORAL | 0 refills | Status: DC

## 2017-12-26 NOTE — Progress Notes (Signed)
Subjective:     Patient ID: Tyler Brock, male   DOB: March 03, 1991, 27 y.o.   MRN: [id number]  HPI Was listed on computer for patient to reestablish care. However, it has been less than 3 years since he was seen in this clinic by another provider. Therefore, he is established patient  Here to discuss ADD issues and another issue as below. Long-standing history of ADD. He took Vyvanse 40 mg daily years ago. This worked well for him with no side effects. He was considering military for some time and we cannot be on this and he stopped. 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 He is just recently returned here from [place] he was constantly and works for [employer]. He has multiple job responsibilities and has had easy distraction and difficulty completing pass. Requesting 1 back on Vyvanse.  Other issues he had a few episodes of discrete anxiety recently last about 10 minutes. These are associated with sweats, dyspnea, tremors, nausea. No prior history of panic disorder. Are very discrete and then resolved. He is not interested in medication at this point  Past Medical History:  Diagnosis Date  . ADD (attention deficit disorder)   . Asthma    exercise induced - only one time.   . ESBL (extended spectrum beta-lactamase) producing bacteria infection   . Gilbert's disease   . Jaundice of newborn   . Necrotizing fasciitis (HCC)   . Sepsis (HCC) 2015   From right [place].   Past Surgical History:  Procedure Laterality Date  . APPLICATION OF WOUND VAC Right 09/05/2014   Procedure: APPLICATION OF WOUND VAC;  Surgeon: Cheral Almas, MD;  Location: [facility] OR;  Service: Orthopedics;  Laterality: Right;  . APPLICATION OF WOUND VAC Right 09/17/2014   Procedure: APPLICATION OF WOUND VAC;  Surgeon: Cheral Almas, MD;  [facility]: [facility] OR;  Service: Orthopedics;  Laterality: Right;  . APPLICATION OF WOUND VAC Right 09/24/2014   Procedure: APPLICATION OF WOUND VAC  to Right Ankle and Right Thigh;  Surgeon: Cheral Almas, MD;  Location[facility] OR;  Service: Orthopedics;  Laterality: Right;  . APPLICATION OF WOUND VAC Right 10/08/2014   Procedure: APPLICATION OF WOUND VAC;  Surgeon: Cheral Almas, MD;  Location: [facility] OR;  Service: Orthopedics;  Laterality: Right;  Applied to right upper inner thigh and right lateral ankle  . I&D EXTREMITY Right 09/04/2014   Procedure: IRRIGATION AND DEBRIDEMENT RIGHT LEG;  Surgeon: Cheral Almas, MD;  Location: [facility] OR;  Service: Orthopedics;  Laterality: Right;  . I&D EXTREMITY Right 09/05/2014   Procedure: IRRIGATION AND DEBRIDEMENT EXTREMITY, Closure of right inner thigh wound;  Surgeon: Cheral Almas, MD;  [facility] OR;  Service: Orthopedics;  Laterality: Right;  . I&D EXTREMITY Right 09/08/2014   Procedure: IRRIGATION AND DEBRIDEMENT RIGHT LEG WITH APPLICATION OF WOUND VAC;  Surgeon: Cheral Almas, MD;  Location: [facility] OR;  Service: Orthopedics;  Laterality: Right;  . I&D EXTREMITY Right 09/17/2014   Procedure: IRRIGATION AND DEBRIDEMENT RIGHT ANKLE, INTEGRA PLACEMENT, WOUND VAC;  Surgeon: Cheral Almas, MD;  [facility]: [facility] OR;  Service: Orthopedics;  Laterality: Right;  . I&D EXTREMITY Right 09/24/2014   Procedure: IRRIGATION AND DEBRIDEMENT of RIGHT LOWER EXTREMITY;  Surgeon: Cheral Almas, MD;  [facility]: [facility];  Service: Orthopedics;  Laterality: Right;  . I&D EXTREMITY Right 10/08/2014   Procedure: IRRIGATION AND DEBRIDEMENT RIGHT ANKLE;  Surgeon: Cheral Almas, MD;  Location: [facility] OR;  Service: Orthopedics;  Laterality: Right;  . INCISION AND DRAINAGE OF WOUND Right 09/24/2014   Procedure: IRRIGATION AND DEBRIDEMENT of RIGHT THIGH WOUND;  Surgeon: Cheral AlmasNaiping Michael Xu, MD;  Location: [facility] OR;  Service: Orthopedics;  Laterality: Right;  . SKIN SPLIT GRAFT Right 10/08/2014   Procedure: SKIN GRAFT SPLIT THICKNESS;  Surgeon: Cheral AlmasNaiping Michael Xu, MD;  [facility];  Service: Orthopedics;  Laterality:  Right;  Graft from right  upper outer thigh to right lateral ankle    reports that  has never smoked. he has never used smokeless tobacco. He reports that he drinks alcohol. He reports that he does not use drugs. family history includes Alcohol abuse in his father; Arthritis in his other; Cancer in his father and other; Hyperlipidemia in his other; Hypertension in his other. Allergies  Allergen Reactions  . Codeine Sulfate Other (See Comments)    Childhood allergy  . Versed [Midazolam] Other (See Comments)    Becomes combative     Review of Systems  Constitutional: Negative for fatigue.  Eyes: Negative for visual disturbance.  Respiratory: Negative for cough, chest tightness and shortness of breath.   Cardiovascular: Negative for chest pain, palpitations and leg swelling.  Neurological: Negative for dizziness, syncope, weakness, light-headedness and headaches.  Psychiatric/Behavioral: The patient is nervous/anxious.        Objective:   Physical Exam  Constitutional: He is oriented to person, place, and time. He appears well-developed and well-nourished.  HENT:  Right Ear: External ear normal.  Left Ear: External ear normal.  Mouth/Throat: Oropharynx is clear and moist.  Eyes: Pupils are equal, round, and reactive to light.  Neck: Neck supple. No thyromegaly present.  Cardiovascular: Normal rate and regular rhythm. Exam reveals no gallop and no friction rub.  No murmur heard. Pulmonary/Chest: Effort normal and breath sounds normal. No respiratory distress. He has no wheezes. He has no rales.  Musculoskeletal: He exhibits no edema.  Lymphadenopathy:    He has no cervical adenopathy.  Neurological: He is alert and oriented to person, place, and time.  Psychiatric: He has a normal mood and affect. His behavior is normal. Thought content normal.       Assessment:     #1 ADD. Patient requesting going back on medication  #2 discrete anxiety episodes as above suspicious for  panic attack    Plan:     -Start back Vyvanse 40 mg once daily. Reviewed potential side effects. Touch base within one month to give some feedback regarding how this is working at this dosage  -We discussed panic attack/disorder. We discussed issues regarding preventative medication. At this point, he is not desiring to start prevention. If symptoms persist or worsen consider serotonin reuptake inhibitor such as Prozac, Zoloft, or Lexapro  Kristian CoveyBruce W Josafat Enrico MD [facility]

## 2018-02-09 ENCOUNTER — Ambulatory Visit: Payer: Self-pay

## 2018-02-09 ENCOUNTER — Other Ambulatory Visit: Payer: Self-pay | Admitting: Occupational Medicine

## 2018-02-09 DIAGNOSIS — M79644 Pain in right finger(s): Secondary | ICD-10-CM

## 2018-02-26 ENCOUNTER — Ambulatory Visit: Payer: BLUE CROSS/BLUE SHIELD | Admitting: Family Medicine

## 2018-02-26 ENCOUNTER — Encounter: Payer: Self-pay | Admitting: Family Medicine

## 2018-02-26 DIAGNOSIS — F988 Other specified behavioral and emotional disorders with onset usually occurring in childhood and adolescence: Secondary | ICD-10-CM

## 2018-02-26 DIAGNOSIS — H6000 Abscess of external ear, unspecified ear: Secondary | ICD-10-CM | POA: Diagnosis not present

## 2018-02-26 DIAGNOSIS — L219 Seborrheic dermatitis, unspecified: Secondary | ICD-10-CM | POA: Diagnosis not present

## 2018-02-26 MED ORDER — LISDEXAMFETAMINE DIMESYLATE 40 MG PO CAPS: 40.0000 mg | ORAL_CAPSULE | ORAL | 0 refills | Status: DC

## 2018-02-26 MED ORDER — DOXYCYCLINE HYCLATE 100 MG PO CAPS: 100.0000 mg | ORAL_CAPSULE | Freq: Two times a day (BID) | ORAL | 0 refills | Status: DC

## 2018-02-26 MED ORDER — MOMETASONE FUROATE 0.1 % EX SOLN
Freq: Every day | CUTANEOUS | 1 refills | Status: AC
Start: 2018-02-26 — End: ?

## 2018-02-26 NOTE — Patient Instructions (Signed)
Seborrheic Dermatitis, Adult Seborrheic dermatitis is a skin disease that causes red, scaly patches. It usually occurs on the scalp, and it is often called dandruff. The patches may appear on other parts of the body. Skin patches tend to appear where there are many oil glands in the skin. Areas of the body that are commonly affected include:  Scalp.  Skin folds of the body.  Ears.  Eyebrows.  Neck.  Face.  Armpits.  The bearded area of men's faces.  The condition may come and go for no known reason, and it is often long-lasting (chronic). What are the causes? The cause of this condition is not known. What increases the risk? This condition is more likely to develop in people who:  Have certain conditions, such as: ? HIV (human immunodeficiency virus). ? AIDS (acquired immunodeficiency syndrome). ? Parkinson disease. ? Mood disorders, such as depression.  Are 40-60 years old.  What are the signs or symptoms? Symptoms of this condition include:  Thick scales on the scalp.  Redness on the face or in the armpits.  Skin that is flaky. The flakes may be white or yellow.  Skin that seems oily or dry but is not helped with moisturizers.  Itching or burning in the affected areas.  How is this diagnosed? This condition is diagnosed with a medical history and physical exam. A sample of your skin may be tested (skin biopsy). You may need to see a skin specialist (dermatologist). How is this treated? There is no cure for this condition, but treatment can help to manage the symptoms. You may get treatment to remove scales, lower the risk of skin infection, and reduce swelling or itching. Treatment may include:  Creams that reduce swelling and irritation (steroids).  Creams that reduce skin yeast.  Medicated shampoo, soaps, moisturizing creams, or ointments.  Medicated moisturizing creams or ointments.  Follow these instructions at home:  Apply over-the-counter and  prescription medicines only as told by your health care provider.  Use any medicated shampoo, soaps, skin creams, or ointments only as told by your health care provider.  Keep all follow-up visits as told by your health care provider. This is important. Contact a health care provider if:  Your symptoms do not improve with treatment.  Your symptoms get worse.  You have new symptoms. This information is not intended to replace advice given to you by your health care provider. Make sure you discuss any questions you have with your health care provider. Document Released: 11/21/2005 Document Revised: 06/10/2016 Document Reviewed: 03/10/2016 [employer] Interactive Patient Education  2018 [employer]  

## 2018-02-26 NOTE — Progress Notes (Signed)
Subjective:     Patient ID: Tyler Brock, male   DOB: 05/06/1991, 27 y.o.   MRN: [id number]  HPI Patient seen for several issues as follows  History of attention deficit disorder. Requesting refills of Vyvanse. This is working well for him. No insomnia  Painful left ear. One day duration. He put in some hydroperoxide yesterday. Does not have any obvious drainage. No fevers or chills  Patient has rash on his face nasolabial fold and eyebrow region. Also involves ear canals. Scaly and pruritic. No relief with moisturizers  Progressive over several weeks.  Past Medical History:  Diagnosis Date  . ADD (attention deficit disorder)   . Asthma    exercise induced - only one time.   . ESBL (extended spectrum beta-lactamase) producing bacteria infection   . Gilbert's disease   . Jaundice of newborn   . Necrotizing fasciitis (HCC)   . Sepsis (HCC) 2015   From right ankle wound after [place].   Past Surgical History:  Procedure Laterality Date  . APPLICATION OF WOUND VAC Right 09/05/2014   Procedure: APPLICATION OF WOUND VAC;  Surgeon: Cheral Almas, MD;  [facility];  Service: Orthopedics;  Laterality: Right;  . APPLICATION OF WOUND VAC Right 09/17/2014   Procedure: APPLICATION OF WOUND VAC;  Surgeon: Cheral Almas, MD;  [facility]: [facility];  Service: Orthopedics;  Laterality: Right;  . APPLICATION OF WOUND VAC Right 09/24/2014   Procedure: APPLICATION OF WOUND VAC to Right Ankle and Right Thigh;  Surgeon: Cheral Almas, MD;  Location: [facility] OR;  Service: Orthopedics;  Laterality: Right;  . APPLICATION OF WOUND VAC Right 10/08/2014   Procedure: APPLICATION OF WOUND VAC;  Surgeon: Cheral Almas, MD;  [facility];  Service: Orthopedics;  Laterality: Right;  Applied to right upper inner thigh and right lateral ankle  . I&D EXTREMITY Right 09/04/2014   Procedure: IRRIGATION AND DEBRIDEMENT RIGHT LEG;  Surgeon: Cheral Almas, MD;  Location: [facility] OR;  Service:  Orthopedics;  Laterality: Right;  . I&D EXTREMITY Right 09/05/2014   Procedure: IRRIGATION AND DEBRIDEMENT EXTREMITY, Closure of right inner thigh wound;  Surgeon: Cheral Almas, MD;  [facility];  Service: Orthopedics;  Laterality: Right;  . I&D EXTREMITY Right 09/08/2014   Procedure: IRRIGATION AND DEBRIDEMENT RIGHT LEG WITH APPLICATION OF WOUND VAC;  Surgeon: Cheral Almas, MD;  [facility];  Service: Orthopedics;  Laterality: Right;  . I&D EXTREMITY Right 09/17/2014   Procedure: IRRIGATION AND DEBRIDEMENT RIGHT ANKLE, INTEGRA PLACEMENT, WOUND VAC;  Surgeon: Cheral Almas, MD;  [facility];  Service: Orthopedics;  Laterality: Right;  . I&D EXTREMITY Right 09/24/2014   Procedure: IRRIGATION AND DEBRIDEMENT of RIGHT LOWER EXTREMITY;  Surgeon: Cheral Almas, MD;  [facility];  Service: Orthopedics;  Laterality: Right;  . I&D EXTREMITY Right 10/08/2014   Procedure: IRRIGATION AND DEBRIDEMENT RIGHT ANKLE;  Surgeon: Cheral Almas, MD;  [facility];  Service: Orthopedics;  Laterality: Right;  . INCISION AND DRAINAGE OF WOUND Right 09/24/2014   Procedure: IRRIGATION AND DEBRIDEMENT of RIGHT THIGH WOUND;  Surgeon: Cheral Almas, MD;  [facility];  Service: Orthopedics;  Laterality: Right;  . SKIN SPLIT GRAFT Right 10/08/2014   Procedure: SKIN GRAFT SPLIT THICKNESS;  Surgeon: Cheral Almas, MD;  Location: [facility] OR;  Service: Orthopedics;  Laterality: Right;  Graft from right  upper outer thigh to right lateral ankle    reports that he has never  smoked. He has never used smokeless tobacco. He reports that he drinks alcohol. He reports that he does not use drugs. family history includes Alcohol abuse in his father; Arthritis in his other; Cancer in his father and other; Hyperlipidemia in his other; Hypertension in his other. Allergies  Allergen Reactions  . Codeine Sulfate Other (See Comments)    Childhood allergy  . Versed [Midazolam] Other (See  Comments)    Becomes combative     Review of Systems  Constitutional: Negative for chills and fever.  HENT: Positive for ear pain. Negative for hearing loss and sore throat.   Respiratory: Negative for cough.   Skin: Positive for rash.  Neurological: Negative for headaches.  Psychiatric/Behavioral: Negative for sleep disturbance. The patient is not nervous/anxious.        Objective:   Physical Exam  Constitutional: He appears well-developed and well-nourished.  HENT:  Left ear canal reveals furuncle just inside the tragus that appears to draining pus. Tender to palpation Has severe eczema involving both ear canals with significant scaling  Cardiovascular: Normal rate and regular rhythm.  Pulmonary/Chest: Breath sounds normal. No respiratory distress. He has no wheezes. He has no rales.  Skin: Rash noted.  Patient has scaly rash external ear canals bilaterally nasolabial folds and malar area of the face and eyebrow region. Erythematous base and scaly surface. No pustules       Assessment:     #1 attention deficit disorder  #2 seborrheic dermatitis involving face and ear canals  #3 furuncle left ear which is already draining    Plan:     -Refilled Vyvanse for 3 months -Elocon lotion to use once daily to external ear canals. He already has some triamcinolone ointment which he will use to face as needed -Doxycycline 100 mg twice daily for 7 days -We explained that seborrheic eczema is not "curable" can be managed over time with low potency steroids as needed for outbreaks  Kristian CoveyBruce W Krista Som MD [facility]

## 2018-11-06 ENCOUNTER — Ambulatory Visit: Payer: Self-pay | Admitting: Family Medicine

## 2018-11-12 ENCOUNTER — Encounter: Payer: Self-pay | Admitting: Family Medicine

## 2018-11-12 ENCOUNTER — Other Ambulatory Visit: Payer: Self-pay

## 2018-11-12 ENCOUNTER — Ambulatory Visit: Payer: BLUE CROSS/BLUE SHIELD | Admitting: Family Medicine

## 2018-11-12 VITALS — BP 128/74 | HR 73 | Temp 98.0°F | Ht 71.0 in | Wt 191.6 lb

## 2018-11-12 DIAGNOSIS — B078 Other viral warts: Secondary | ICD-10-CM

## 2018-11-12 DIAGNOSIS — M79645 Pain in left finger(s): Secondary | ICD-10-CM

## 2018-11-12 DIAGNOSIS — F988 Other specified behavioral and emotional disorders with onset usually occurring in childhood and adolescence: Secondary | ICD-10-CM

## 2018-11-12 DIAGNOSIS — Z23 Encounter for immunization: Secondary | ICD-10-CM | POA: Diagnosis not present

## 2018-11-12 MED ORDER — LISDEXAMFETAMINE DIMESYLATE 40 MG PO CAPS
40.0000 mg | ORAL_CAPSULE | ORAL | 0 refills | Status: AC
Start: 2018-11-12 — End: 2018-12-12

## 2018-11-12 MED ORDER — LISDEXAMFETAMINE DIMESYLATE 40 MG PO CAPS: 40.0000 mg | ORAL_CAPSULE | ORAL | 0 refills | Status: AC

## 2018-11-12 NOTE — Progress Notes (Signed)
Subjective:     Patient ID: Tyler Brock, male   DOB: Sep 13, 1991, 27 y.o.   MRN: [id number]  HPI  Seen for several things today as follows  Attention deficit disorder.  He remains on Vyvanse.  Requesting refills.  This is working well for him.  No hypertension issues.  No headache.  No insomnia.  Recent injury left index finger.  This was accidentally abraded on a piece of equipment at work.  No foreign bodies.  No rust.  Last tetanus 2012.  He was doing well when this injury first occurred a week ago he then [place] and after he was there (which was about 4 days after accident) developed a few days of some redness and swelling.  That resolved spontaneously.  No fever.  No pain at this time.  Patient has some common warts including right hand and also left thumb.  He may consider over-the-counter treatment.  Past Medical History:  Diagnosis Date  . ADD (attention deficit disorder)   . Asthma    exercise induced - only one time.   . ESBL (extended spectrum beta-lactamase) producing bacteria infection   . Gilbert's disease   . Jaundice of newborn   . Necrotizing fasciitis (HCC)   . Sepsis (HCC) 2015   From right ankle wound [place].   Past Surgical History:  Procedure Laterality Date  . APPLICATION OF WOUND VAC Right 09/05/2014   Procedure: APPLICATION OF WOUND VAC;  Surgeon: Cheral AlmasNaiping Michael Xu, MD;  [facility];  Service: Orthopedics;  Laterality: Right;  . APPLICATION OF WOUND VAC Right 09/17/2014   Procedure: APPLICATION OF WOUND VAC;  Surgeon: Cheral AlmasNaiping Michael Xu, MD;  [facility];  Service: Orthopedics;  Laterality: Right;  . APPLICATION OF WOUND VAC Right 09/24/2014   Procedure: APPLICATION OF WOUND VAC to Right Ankle and Right Thigh;  Surgeon: Cheral AlmasNaiping Michael Xu, MD;  [facility];  Service: Orthopedics;  Laterality: Right;  . APPLICATION OF WOUND VAC Right 10/08/2014   Procedure: APPLICATION OF WOUND VAC;  Surgeon: Cheral AlmasNaiping Michael  Xu, MD;  [facility];  Service: Orthopedics;  Laterality: Right;  Applied to right upper inner thigh and right lateral ankle  . I&D EXTREMITY Right 09/04/2014   Procedure: IRRIGATION AND DEBRIDEMENT RIGHT LEG;  Surgeon: Cheral AlmasNaiping Michael Xu, MD;  [facility];  Service: Orthopedics;  Laterality: Right;  . I&D EXTREMITY Right 09/05/2014   Procedure: IRRIGATION AND DEBRIDEMENT EXTREMITY, Closure of right inner thigh wound;  Surgeon: Cheral AlmasNaiping Michael Xu, MD;  [facility];  Service: Orthopedics;  Laterality: Right;  . I&D EXTREMITY Right 09/08/2014   Procedure: IRRIGATION AND DEBRIDEMENT RIGHT LEG WITH APPLICATION OF WOUND VAC;  Surgeon: Cheral AlmasNaiping Michael Xu, MD;  [facility];  Service: Orthopedics;  Laterality: Right;  . I&D EXTREMITY Right 09/17/2014   Procedure: IRRIGATION AND DEBRIDEMENT RIGHT ANKLE, INTEGRA PLACEMENT, WOUND VAC;  Surgeon: Cheral AlmasNaiping Michael Xu, MD;  [facility];  Service: Orthopedics;  Laterality: Right;  . I&D EXTREMITY Right 09/24/2014   Procedure: IRRIGATION AND DEBRIDEMENT of RIGHT LOWER EXTREMITY;  Surgeon: Cheral AlmasNaiping Michael Xu, MD;  [facility];  Service: Orthopedics;  Laterality: Right;  . I&D EXTREMITY Right 10/08/2014   Procedure: IRRIGATION AND DEBRIDEMENT RIGHT ANKLE;  Surgeon: Cheral AlmasNaiping Michael Xu, MD;  [facility];  Service: Orthopedics;  Laterality: Right;  . INCISION AND DRAINAGE OF WOUND Right 09/24/2014   Procedure: IRRIGATION AND DEBRIDEMENT of RIGHT THIGH WOUND;  Surgeon: Coralyn MarkNaiping  Glee Arvin, MD;  Location: [facility] OR;  Service: Orthopedics;  Laterality: Right;  . SKIN SPLIT GRAFT Right 10/08/2014   Procedure: SKIN GRAFT SPLIT THICKNESS;  Surgeon: Cheral Almas, MD;  Location: [facility] OR;  Service: Orthopedics;  Laterality: Right;  Graft from right  upper outer thigh to right lateral ankle    reports that he has never smoked. He has never used smokeless tobacco. He reports that he drinks alcohol. He reports that he does not use drugs. family history  includes Alcohol abuse in his father; Arthritis in his other; Cancer in his father and other; Hyperlipidemia in his other; Hypertension in his other. Allergies  Allergen Reactions  . Codeine Sulfate Other (See Comments)    Childhood allergy  . Versed [Midazolam] Other (See Comments)    Becomes combative    Review of Systems  Constitutional: Negative for chills, fatigue and fever.  Eyes: Negative for visual disturbance.  Respiratory: Negative for cough, chest tightness and shortness of breath.   Cardiovascular: Negative for chest pain, palpitations and leg swelling.  Neurological: Negative for dizziness, syncope, weakness, light-headedness and headaches.       Objective:   Physical Exam  Constitutional: He is oriented to person, place, and time. He appears well-developed and well-nourished.  HENT:  Right Ear: External ear normal.  Left Ear: External ear normal.  Mouth/Throat: Oropharynx is clear and moist.  Eyes: Pupils are equal, round, and reactive to light.  Neck: Neck supple. No thyromegaly present.  Cardiovascular: Normal rate and regular rhythm.  Pulmonary/Chest: Effort normal and breath sounds normal. No respiratory distress. He has no wheezes. He has no rales.  Musculoskeletal: He exhibits no edema.  Neurological: He is alert and oriented to person, place, and time.  Skin:  Small eschar left index finger dorsally over PIP joint.  Full range of motion of the joint.  No redness.  No fluctuance.  No warmth.  No bony tenderness.  He has some small common warts including right hand and left thumb dorsally near the base of the nail       Assessment:     #1 attention deficit disorder stable on Vyvanse  #2 history of recent injury left index finger.  Low clinical suspicion for fracture or bony injury.  No signs of cellulitis at this time  #3 Common warts involving right hand and left thumb    Plan:     -Refill Vyvanse for 3 months -Follow-up promptly for any redness,  warmth, swelling, or other concerns regarding his finger.  This seems to be healing well -We discussed treatments for common wart including liquid nitrogen.  This point he wishes to wait and observe.  He may consider over-the-counter treatments.  Kristian Covey MD [facility]

## 2019-07-27 ENCOUNTER — Ambulatory Visit (INDEPENDENT_AMBULATORY_CARE_PROVIDER_SITE_OTHER): Payer: BC Managed Care – PPO | Admitting: Family Medicine

## 2019-07-27 ENCOUNTER — Encounter: Payer: Self-pay | Admitting: Family Medicine

## 2019-07-27 ENCOUNTER — Other Ambulatory Visit: Payer: Self-pay

## 2019-07-27 VITALS — Ht 71.0 in | Wt 191.0 lb

## 2019-07-27 DIAGNOSIS — L03116 Cellulitis of left lower limb: Secondary | ICD-10-CM | POA: Diagnosis not present

## 2019-07-27 MED ORDER — DOXYCYCLINE HYCLATE 100 MG PO TABS: 100.0000 mg | ORAL_TABLET | Freq: Two times a day (BID) | ORAL | 0 refills | Status: DC

## 2019-07-27 MED ORDER — MUPIROCIN 2 % EX OINT: 1.0000 "application " | TOPICAL_OINTMENT | Freq: Two times a day (BID) | CUTANEOUS | 0 refills | Status: AC

## 2019-07-27 MED ORDER — PREDNISONE 5 MG PO TABS: ORAL_TABLET | ORAL | 0 refills | Status: DC

## 2019-07-27 MED ORDER — HYDROXYZINE HCL 25 MG PO TABS: 25.0000 mg | ORAL_TABLET | Freq: Three times a day (TID) | ORAL | 0 refills | Status: AC | PRN

## 2019-07-27 NOTE — Progress Notes (Signed)
Virtual Visit via Video   Due to the COVID-19 pandemic, this visit was completed with telemedicine (audio/video) technology to reduce patient and provider exposure as well as to preserve personal protective equipment.   I connected with Bethann Berkshireaniel Alkins by a video enabled telemedicine application and verified that I am speaking with the correct person using two identifiers. Location patient: Home Location provider: [facility], Office Persons participating in the virtual visit: Bethann BerkshireDaniel Rohner, Helane RimaErica Isador Castille, DO   I discussed the limitations of evaluation and management by telemedicine and the availability of in person appointments. The patient expressed understanding and agreed to proceed.  Care Team   Patient Care Team: Kristian CoveyBurchette, Bruce W, MD as PCP - General  Subjective:   HPI: Spider bite 6 days ago on right leg inner thigh above knee. Has several small blister in area. There is some redness area and extreme itching. He did have a chemical spray the same day at work but changed clothing and cleaned area immediately. No swelling or discharge from area.   Review of Systems  Constitutional: Negative for chills and fever.  HENT: Negative for hearing loss and tinnitus.   Eyes: Negative for blurred vision and double vision.  Respiratory: Negative for cough and hemoptysis.   Cardiovascular: Negative for chest pain, palpitations and leg swelling.  Gastrointestinal: Negative for nausea and vomiting.  Genitourinary: Negative for dysuria and urgency.  Neurological: Negative for dizziness and headaches.     Patient Active Problem List   Diagnosis Date Noted  . ESBL (extended spectrum beta-lactamase) producing bacteria infection   . Open wound of right ankle 09/24/2014  . Ankle wound 09/24/2014  . Necrotizing fasciitis (HCC) 09/04/2014  . Septic shock (HCC) 09/03/2014  . Cellulitis 05/10/2011  . ALLERGIC RHINITIS 12/10/2010  . WART, VIRAL 08/05/2010  . DERMATITIS, SEBORRHEIC  08/05/2010  . FOLLICULITIS 08/05/2010  . ALLERGIC URTICARIA 08/05/2010  . ALOPECIA 05/06/2009  . ASTHMA 04/30/2009  . ADD (attention deficit disorder) 04/10/2009    Social History   Tobacco Use  . Smoking status: Never Smoker  . Smokeless tobacco: Never Used  Substance Use Topics  . Alcohol use: Yes    Comment: socially    Current Outpatient Medications:  .  lisdexamfetamine (VYVANSE) 40 MG capsule, Take 1 capsule (40 mg total) by mouth every morning., Disp: 30 capsule, Rfl: 0 .  lisdexamfetamine (VYVANSE) 40 MG capsule, Take 1 capsule (40 mg total) by mouth every morning., Disp: 30 capsule, Rfl: 0 .  mometasone (ELOCON) 0.1 % lotion, Apply topically daily., Disp: 60 mL, Rfl: 1 .  triamcinolone cream (KENALOG) 0.1 %, Apply 1 application topically 2 (two) times daily. For flare ups., Disp: 30 g, Rfl: 2 .  doxycycline (VIBRA-TABS) 100 MG tablet, Take 1 tablet (100 mg total) by mouth 2 (two) times daily., Disp: 20 tablet, Rfl: 0 .  hydrOXYzine (ATARAX/VISTARIL) 25 MG tablet, Take 1 tablet (25 mg total) by mouth every 8 (eight) hours as needed for itching (insomnia)., Disp: 20 tablet, Rfl: 0 .  lisdexamfetamine (VYVANSE) 40 MG capsule, Take 1 capsule (40 mg total) by mouth every morning., Disp: 30 capsule, Rfl: 0 .  mupirocin ointment (BACTROBAN) 2 %, Place 1 application into the nose 2 (two) times daily., Disp: 22 g, Rfl: 0 .  predniSONE (DELTASONE) 5 MG tablet, 6-5-4-3-2-1-off, Disp: 21 tablet, Rfl: 0  Allergies  Allergen Reactions  . Codeine Sulfate Other (See Comments)    Childhood allergy  . Versed [Midazolam] Other (See Comments)    Becomes  combative    Objective:   VITALS: Per patient if applicable, see vitals. GENERAL: Alert, appears well and in no acute distress. HEENT: Atraumatic, conjunctiva clear, no obvious abnormalities on inspection of external nose and ears. NECK: Normal movements of the head and neck. CARDIOPULMONARY: No increased WOB. Speaking in clear  sentences. I:E ratio WNL.  MS: Moves all visible extremities without noticeable abnormality. PSYCH: Pleasant and cooperative, well-groomed. Speech normal rate and rhythm. Affect is appropriate. Insight and judgement are appropriate. Attention is focused, linear, and appropriate.  NEURO: CN grossly intact. Oriented as arrived to appointment on time with no prompting. Moves both UE equally.  SKIN: Left inner thigh with 3-4 cm diameter circular pustular lesion. Central sparing. Slight streaking toward groin. No ulceration, drainage, necrosis visualized. Itchy per patient.   Depression screen [facility] 2/9 11/12/2018 11/12/2014 10/22/2014  Decreased Interest 0 0 0  Down, Depressed, Hopeless 0 0 0  PHQ - 2 Score 0 0 0   Assessment and Plan:   Benicio was seen today for insect bite.  Diagnoses and all orders for this visit:  Cellulitis of left lower extremity Comments: Possible spider bite with rxn. Hx of necotizing faciitis. Will treat aggressively with oral Abx. Prednisone, Hydroxyzine, Mupirocin also Rx. Mom is Therapist, sports and can help. Precautions and red flags reviewed.  Orders: -     doxycycline (VIBRA-TABS) 100 MG tablet; Take 1 tablet (100 mg total) by mouth 2 (two) times daily. -     hydrOXYzine (ATARAX/VISTARIL) 25 MG tablet; Take 1 tablet (25 mg total) by mouth every 8 (eight) hours as needed for itching (insomnia). -     predniSONE (DELTASONE) 5 MG tablet; 6-5-4-3-2-1-off -     mupirocin ointment (BACTROBAN) 2 %; Place 1 application into the nose 2 (two) times daily.    Marland Kitchen COVID-19 Education: The signs and symptoms of COVID-19 were discussed with the patient and how to seek care for testing if needed. The importance of social distancing was discussed today. . Reviewed expectations re: course of current medical issues. . Discussed self-management of symptoms. . Outlined signs and symptoms indicating need for more acute intervention. . Patient verbalized understanding and all questions were  answered. Marland Kitchen Health Maintenance issues including appropriate healthy diet, exercise, and smoking avoidance were discussed with patient. . See orders for this visit as documented in the electronic medical record.  Briscoe Deutscher, DO

## 2019-09-09 ENCOUNTER — Ambulatory Visit (INDEPENDENT_AMBULATORY_CARE_PROVIDER_SITE_OTHER): Payer: Self-pay | Admitting: Family Medicine

## 2019-09-09 ENCOUNTER — Encounter: Payer: Self-pay | Admitting: Family Medicine

## 2019-09-09 ENCOUNTER — Other Ambulatory Visit: Payer: Self-pay

## 2019-09-09 VITALS — BP 110/60 | HR 91 | Temp 97.4°F | Wt 188.9 lb

## 2019-09-09 DIAGNOSIS — H66002 Acute suppurative otitis media without spontaneous rupture of ear drum, left ear: Secondary | ICD-10-CM

## 2019-09-09 MED ORDER — CORTISPORIN-TC 3.3-3-10-0.5 MG/ML OT SUSP: 3.0000 [drp] | Freq: Four times a day (QID) | OTIC | 0 refills | Status: AC

## 2019-09-09 MED ORDER — AMOXICILLIN-POT CLAVULANATE 875-125 MG PO TABS: 1.0000 | ORAL_TABLET | Freq: Two times a day (BID) | ORAL | 0 refills | Status: DC

## 2019-09-09 NOTE — Progress Notes (Signed)
Subjective:     Patient ID: Tyler Brock, male   DOB: August 16, 1991, 28 y.o.   MRN: [id number]  HPI Tyler Brock seen with left ear pain.  Started over the weekend.  He felt like he got some water in his ear and used a Q-tip but was not aware of any definite trauma.  He then developed some throbbing sensation of the ear.  No fevers or chills.  No bleeding.  No dizziness.  He was treated with doxycycline back in August for possible cellulitis of the thigh.  Those symptoms fully resolved.  Denies any recent cough or dyspnea.  Past Medical History:  Diagnosis Date  . ADD (attention deficit disorder)   . Asthma    exercise induced - only one time.   . ESBL (extended spectrum beta-lactamase) producing bacteria infection   . Gilbert's disease   . Jaundice of newborn   . Necrotizing fasciitis (HCC)   . Sepsis (HCC) 2015   From right ankle wound [place].   Past Surgical History:  Procedure Laterality Date  . APPLICATION OF WOUND VAC Right 09/05/2014   Procedure: APPLICATION OF WOUND VAC;  Surgeon: Cheral Almas, MD;  [facility];  Service: Orthopedics;  Laterality: Right;  . APPLICATION OF WOUND VAC Right 09/17/2014   Procedure: APPLICATION OF WOUND VAC;  Surgeon: Cheral Almas, MD;  [facility]: [facility] OR;  Service: Orthopedics;  Laterality: Right;  . APPLICATION OF WOUND VAC Right 09/24/2014   Procedure: APPLICATION OF WOUND VAC to Right Ankle and Right Thigh;  Surgeon: Cheral Almas, MD;  Location: [facility] OR;  Service: Orthopedics;  Laterality: Right;  . APPLICATION OF WOUND VAC Right 10/08/2014   Procedure: APPLICATION OF WOUND VAC;  Surgeon: Cheral Almas, MD;  [facility] OR;  Service: Orthopedics;  Laterality: Right;  Applied to right upper inner thigh and right lateral ankle  . I&D EXTREMITY Right 09/04/2014   Procedure: IRRIGATION AND DEBRIDEMENT RIGHT LEG;  Surgeon: Cheral Almas, MD;  Location: [facility] OR;  Service: Orthopedics;  Laterality: Right;  . I&D EXTREMITY  Right 09/05/2014   Procedure: IRRIGATION AND DEBRIDEMENT EXTREMITY, Closure of right inner thigh wound;  Surgeon: Cheral Almas, MD;  [facility];  Service: Orthopedics;  Laterality: Right;  . I&D EXTREMITY Right 09/08/2014   Procedure: IRRIGATION AND DEBRIDEMENT RIGHT LEG WITH APPLICATION OF WOUND VAC;  Surgeon: Cheral Almas, MD;  [facility]: [facility];  Service: Orthopedics;  Laterality: Right;  . I&D EXTREMITY Right 09/17/2014   Procedure: IRRIGATION AND DEBRIDEMENT RIGHT ANKLE, INTEGRA PLACEMENT, WOUND VAC;  Surgeon: Cheral Almas, MD;  [facility];  Service: Orthopedics;  Laterality: Right;  . I&D EXTREMITY Right 09/24/2014   Procedure: IRRIGATION AND DEBRIDEMENT of RIGHT LOWER EXTREMITY;  Surgeon: Cheral Almas, MD;  [facility];  Service: Orthopedics;  Laterality: Right;  . I&D EXTREMITY Right 10/08/2014   Procedure: IRRIGATION AND DEBRIDEMENT RIGHT ANKLE;  Surgeon: Cheral Almas, MD;  [facility]: [facility];  Service: Orthopedics;  Laterality: Right;  . INCISION AND DRAINAGE OF WOUND Right 09/24/2014   Procedure: IRRIGATION AND DEBRIDEMENT of RIGHT THIGH WOUND;  Surgeon: Cheral Almas, MD;  [facility]: [facility];  Service: Orthopedics;  Laterality: Right;  . SKIN SPLIT GRAFT Right 10/08/2014   Procedure: SKIN GRAFT SPLIT THICKNESS;  Surgeon: Cheral Almas, MD;  Location: [facility] OR;  Service: Orthopedics;  Laterality: Right;  Graft from right  upper outer thigh to right lateral ankle  reports that he has never smoked. He has never used smokeless tobacco. He reports current alcohol use. He reports that he does not use drugs. family history includes Alcohol abuse in his father; Arthritis in an other family member; Cancer in his father and another family member; Hyperlipidemia in an other family member; Hypertension in an other family member. Allergies  Allergen Reactions  . Codeine Sulfate Other (See Comments)    Childhood allergy  . Versed [Midazolam] Other (See  Comments)    Becomes combative     Review of Systems  Constitutional: Negative for chills and fever.  HENT: Positive for ear pain. Negative for ear discharge and sore throat.   Respiratory: Negative for cough and shortness of breath.        Objective:   Physical Exam Constitutional:      Appearance: Normal appearance.  HENT:     Ears:     Comments: Right ear canal and eardrum appeared normal Left canal is slightly swollen.  He has significant erythema of the left eardrum but no visible perforation.  Landmarks are distorted. Neurological:     Mental Status: He is alert.        Assessment:     Probable acute suppurative left otitis media.  He does have some mild swelling of the canal as well but no obvious purulent drainage in the canal    Plan:     -Keep ear dry as possible -Augmentin 875 mg twice daily with food -Start Cortisporin otic suspension 3-4 times daily -Office follow-up in 2 weeks if not resolving  Eulas Post MD [facility] at [facility]

## 2019-09-09 NOTE — Patient Instructions (Signed)
Let me see you in 2 weeks if symptoms not resolved.

## 2024-02-08 ENCOUNTER — Emergency Department (HOSPITAL_COMMUNITY)
Admission: EM | Admit: 2024-02-08 | Discharge: 2024-02-09 | Disposition: A | Discharge status: Other Acute Inpatient Hospital | Payer: Self-pay | Attending: Emergency Medicine | Admitting: Emergency Medicine

## 2024-02-08 ENCOUNTER — Encounter (HOSPITAL_COMMUNITY): Payer: Self-pay | Admitting: Emergency Medicine

## 2024-02-08 ENCOUNTER — Other Ambulatory Visit: Payer: Self-pay

## 2024-02-08 DIAGNOSIS — T25122A Burn of first degree of left foot, initial encounter: Secondary | ICD-10-CM | POA: Insufficient documentation

## 2024-02-08 DIAGNOSIS — T2017XA Burn of first degree of neck, initial encounter: Secondary | ICD-10-CM | POA: Insufficient documentation

## 2024-02-08 DIAGNOSIS — T310 Burns involving less than 10% of body surface: Secondary | ICD-10-CM | POA: Insufficient documentation

## 2024-02-08 DIAGNOSIS — T300 Burn of unspecified body region, unspecified degree: Secondary | ICD-10-CM

## 2024-02-08 DIAGNOSIS — X100XXA Contact with hot drinks, initial encounter: Secondary | ICD-10-CM | POA: Insufficient documentation

## 2024-02-08 DIAGNOSIS — Z23 Encounter for immunization: Secondary | ICD-10-CM | POA: Insufficient documentation

## 2024-02-08 DIAGNOSIS — T23102A Burn of first degree of left hand, unspecified site, initial encounter: Secondary | ICD-10-CM | POA: Insufficient documentation
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
## 2024-02-09 MED ORDER — HYDROMORPHONE HCL 1 MG/ML IJ SOLN
1.0000 mg | Freq: Once | INTRAMUSCULAR | Status: AC
  Administered 2024-02-09: 1 mg via INTRAVENOUS
  Filled 2024-02-09: qty 1

## 2024-02-09 MED ORDER — TETANUS-DIPHTH-ACELL PERTUSSIS 5-2.5-18.5 LF-MCG/0.5 IM SUSY
0.5000 mL | PREFILLED_SYRINGE | Freq: Once | INTRAMUSCULAR | Status: AC
  Administered 2024-02-09: 0.5 mL via INTRAMUSCULAR
  Filled 2024-02-09: qty 0.5

## 2024-02-09 MED ORDER — HYDROMORPHONE HCL 1 MG/ML IJ SOLN: 0.5000 mg | Freq: Once | INTRAMUSCULAR | Status: DC

## 2024-02-09 MED ORDER — OXYCODONE HCL 5 MG PO TABS
5.0000 mg | ORAL_TABLET | Freq: Once | ORAL | Status: AC
  Administered 2024-02-09: 5 mg via ORAL
  Filled 2024-02-09: qty 1
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
## 2024-02-09 NOTE — Discharge Instructions (Addendum)
 GO TO:  [facility] [address], [place], [place] [address]  Phone: [phone]  Dr. Aline August will see you in the ER.

## 2024-02-12 ENCOUNTER — Telehealth: Payer: Self-pay | Admitting: Family Medicine
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
## 2024-02-14 ENCOUNTER — Telehealth: Payer: Self-pay | Admitting: Family Medicine

## 2024-02-14 NOTE — Telephone Encounter (Signed)
 Appt scheduled

## 2024-02-15 NOTE — Progress Notes (Signed)
 Pt cancelled appt.  Kristian Covey MD [facility] at [facility]

## 2024-02-16 ENCOUNTER — Encounter: Payer: Self-pay | Admitting: Family Medicine

## 2024-02-16 ENCOUNTER — Telehealth (INDEPENDENT_AMBULATORY_CARE_PROVIDER_SITE_OTHER): Payer: Self-pay | Admitting: Family Medicine

## 2024-02-16 DIAGNOSIS — T22012A Burn of unspecified degree of left forearm, initial encounter: Secondary | ICD-10-CM

## 2024-02-16 DIAGNOSIS — T300 Burn of unspecified body region, unspecified degree: Secondary | ICD-10-CM

## 2024-02-16 DIAGNOSIS — T25022A Burn of unspecified degree of left foot, initial encounter: Secondary | ICD-10-CM

## 2024-02-16 DIAGNOSIS — T2007XA Burn of unspecified degree of neck, initial encounter: Secondary | ICD-10-CM

## 2024-02-16 DIAGNOSIS — T2101XA Burn of unspecified degree of chest wall, initial encounter: Secondary | ICD-10-CM

## 2024-02-16 DIAGNOSIS — T23002A Burn of unspecified degree of left hand, unspecified site, initial encounter: Secondary | ICD-10-CM

## 2024-02-16 NOTE — Telephone Encounter (Signed)
 Images reviewed.  Kristian Covey MD [facility] at [facility]
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
